# Patient Record
Sex: Male | Born: 1995 | Race: Black or African American | Hispanic: No | Marital: Single | Smoking: Never smoker
Health system: Southern US, Community
[De-identification: ages and names within clinical notes are randomized; demographics above are authoritative.]

## PROBLEM LIST (undated history)

## (undated) DIAGNOSIS — S43006A Unspecified dislocation of unspecified shoulder joint, initial encounter: Secondary | ICD-10-CM

## (undated) DIAGNOSIS — Z98811 Dental restoration status: Secondary | ICD-10-CM

## (undated) DIAGNOSIS — S43014A Anterior dislocation of right humerus, initial encounter: Secondary | ICD-10-CM

## (undated) DIAGNOSIS — M419 Scoliosis, unspecified: Secondary | ICD-10-CM

## (undated) DIAGNOSIS — Z9109 Other allergy status, other than to drugs and biological substances: Secondary | ICD-10-CM

## (undated) DIAGNOSIS — M24119 Other articular cartilage disorders, unspecified shoulder: Secondary | ICD-10-CM

## (undated) HISTORY — PX: NO PAST SURGERIES: SHX2092

---
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
## 2014-07-24 ENCOUNTER — Ambulatory Visit: Payer: Medicaid Other

## 2014-07-30 ENCOUNTER — Emergency Department (HOSPITAL_COMMUNITY): Payer: Medicaid Other

## 2014-07-30 ENCOUNTER — Encounter (HOSPITAL_COMMUNITY): Payer: Self-pay

## 2014-07-30 ENCOUNTER — Emergency Department (HOSPITAL_COMMUNITY)
Admission: EM | Admit: 2014-07-30 | Discharge: 2014-07-30 | Disposition: A | Discharge status: Home / Self Care | Payer: Medicaid Other | Attending: Emergency Medicine | Admitting: Emergency Medicine

## 2014-07-30 DIAGNOSIS — Y92321 Football field as the place of occurrence of the external cause: Secondary | ICD-10-CM | POA: Insufficient documentation

## 2014-07-30 DIAGNOSIS — S43001A Unspecified subluxation of right shoulder joint, initial encounter: Secondary | ICD-10-CM | POA: Diagnosis not present

## 2014-07-30 DIAGNOSIS — Y9361 Activity, american tackle football: Secondary | ICD-10-CM | POA: Diagnosis not present

## 2014-07-30 DIAGNOSIS — Y998 Other external cause status: Secondary | ICD-10-CM | POA: Diagnosis not present

## 2014-07-30 DIAGNOSIS — S43004A Unspecified dislocation of right shoulder joint, initial encounter: Secondary | ICD-10-CM

## 2014-07-30 DIAGNOSIS — Z8739 Personal history of other diseases of the musculoskeletal system and connective tissue: Secondary | ICD-10-CM | POA: Diagnosis not present

## 2014-07-30 DIAGNOSIS — W19XXXA Unspecified fall, initial encounter: Secondary | ICD-10-CM

## 2014-07-30 DIAGNOSIS — W1839XA Other fall on same level, initial encounter: Secondary | ICD-10-CM | POA: Insufficient documentation

## 2014-07-30 DIAGNOSIS — S4991XA Unspecified injury of right shoulder and upper arm, initial encounter: Secondary | ICD-10-CM | POA: Diagnosis present

## 2014-07-30 DIAGNOSIS — R52 Pain, unspecified: Secondary | ICD-10-CM

## 2014-07-30 HISTORY — DX: Scoliosis, unspecified: M41.9

## 2014-07-30 IMAGING — CR DG SHOULDER 2+V*R*
2 series · 2 of 2 positions shown · non-contrast
Comparison: None.

CLINICAL DATA: Acute right shoulder pain after playing football.

EXAM:
RIGHT SHOULDER - 2+ VIEW

[x shoulder ap right (1 of 2)]
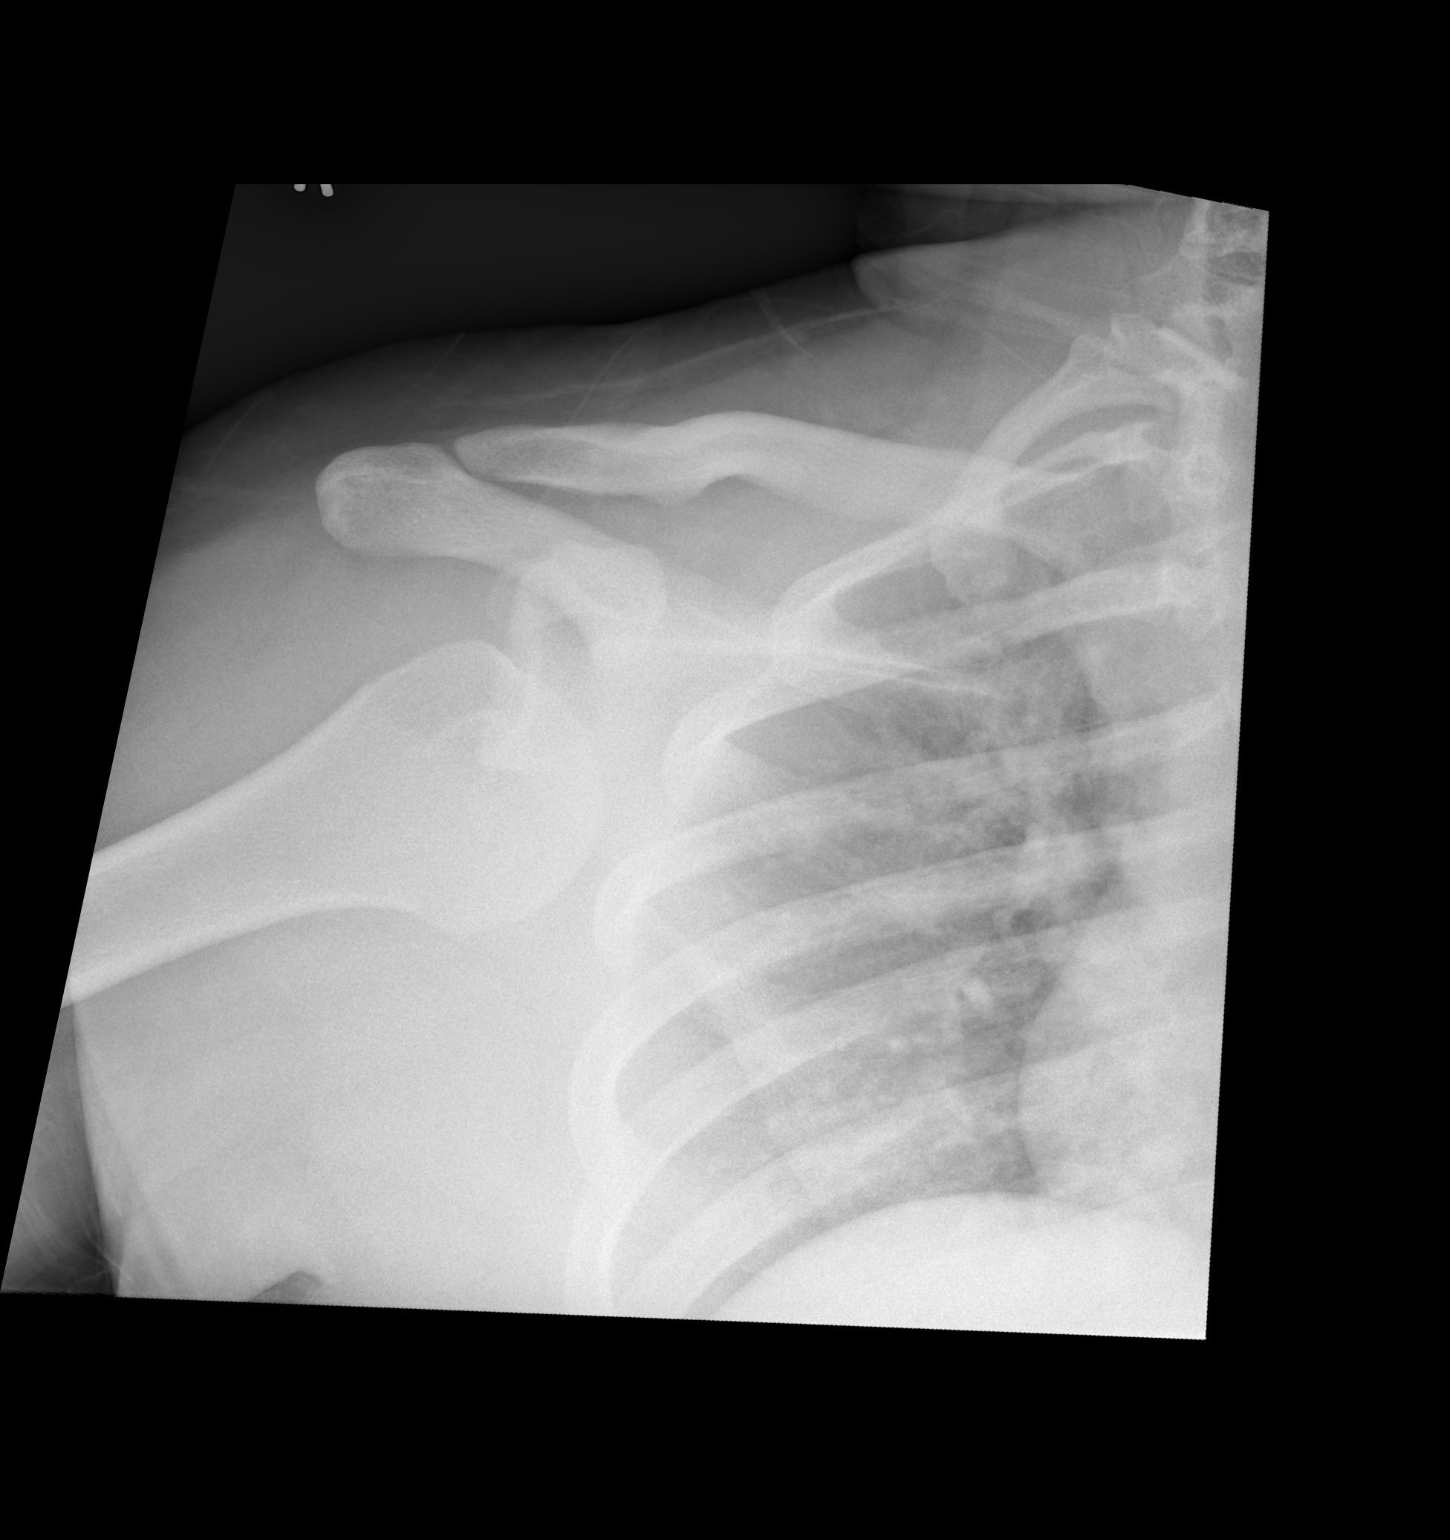

[x shoulder ap right (2 of 2)]
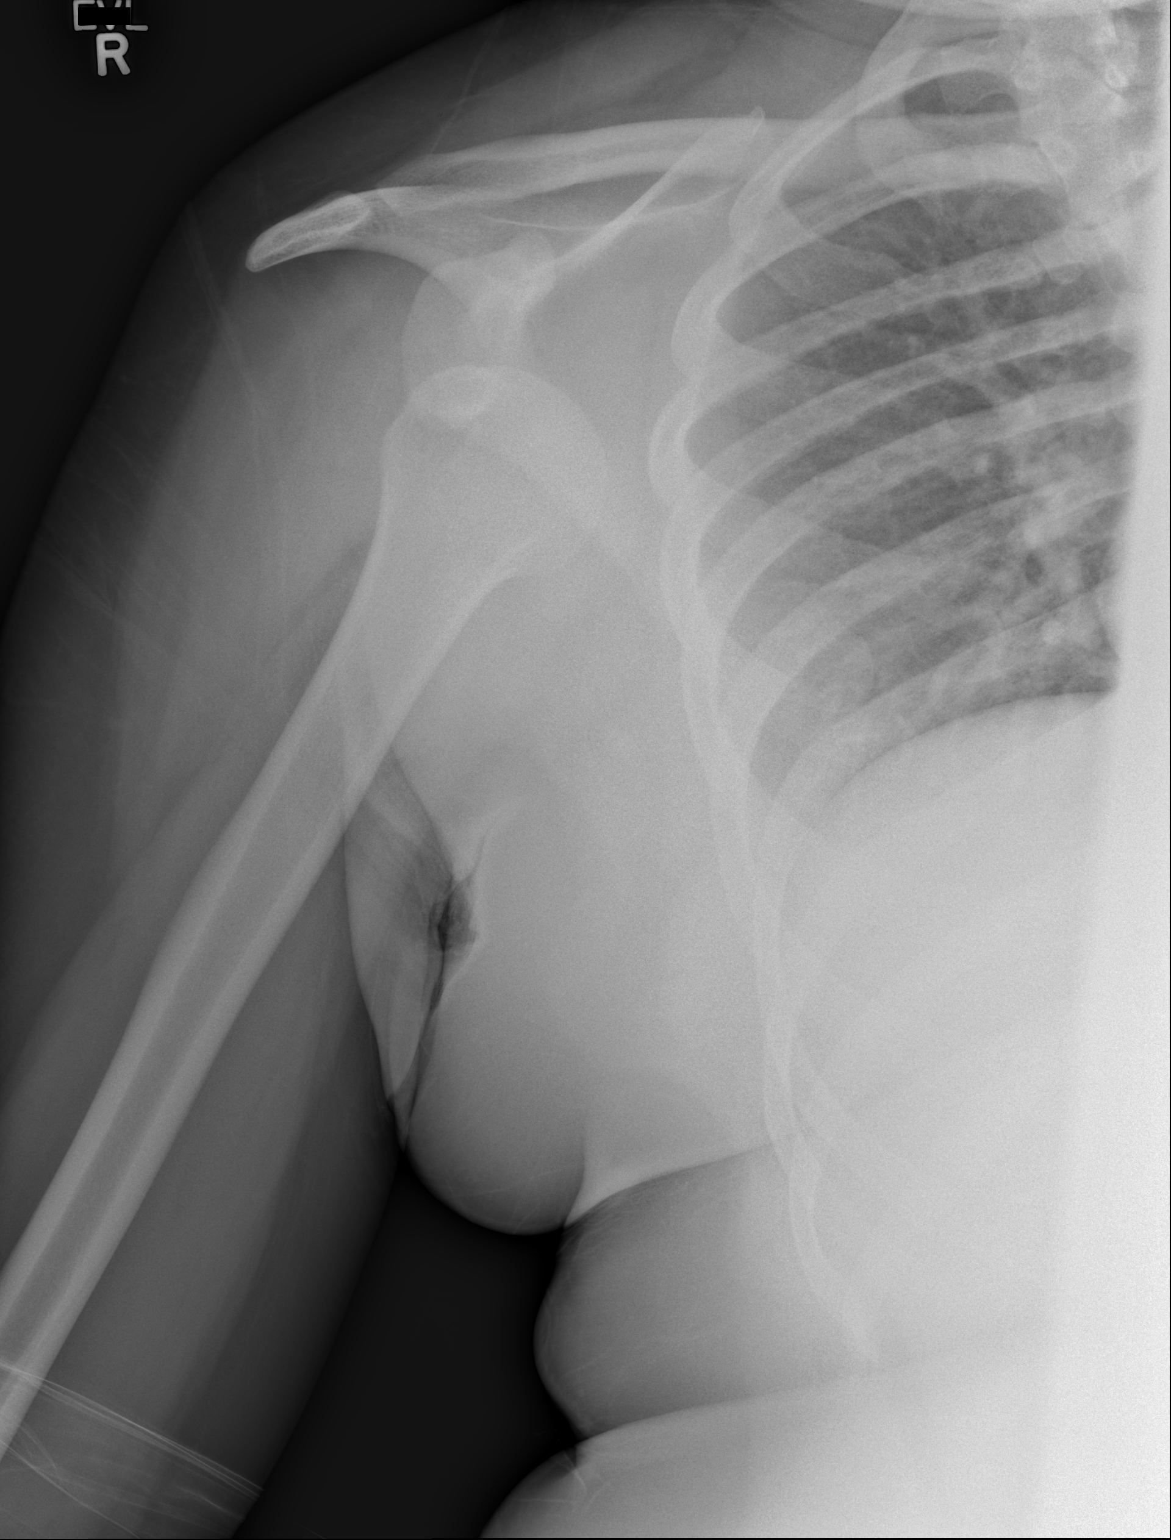

[2 of 2 positions shown; findings below may reference images not displayed]

FINDINGS: Right proximal humeral head is in sub coracoid position consistent
with anterior dislocation. No definite fracture or dislocation is
noted. Visualized ribs appear normal.
IMPRESSION: Anterior dislocation of right proximal humeral head.

## 2014-07-30 IMAGING — CR DG ELBOW COMPLETE 3+V*R*
4 series · 4 of 4 positions shown · non-contrast
Comparison: Right shoulder [DATE]

CLINICAL DATA: Pt was playing football today and injured his Lt
arm, c/o pain mainly towards humeral head and about the proximal [DATE]
of humerus, states Lt fingers are twitching. No prior injury to
area, pt in a lot of pain.

EXAM:
RIGHT ELBOW - COMPLETE 3+ VIEW

[x elbow obl right (1 of 2)]
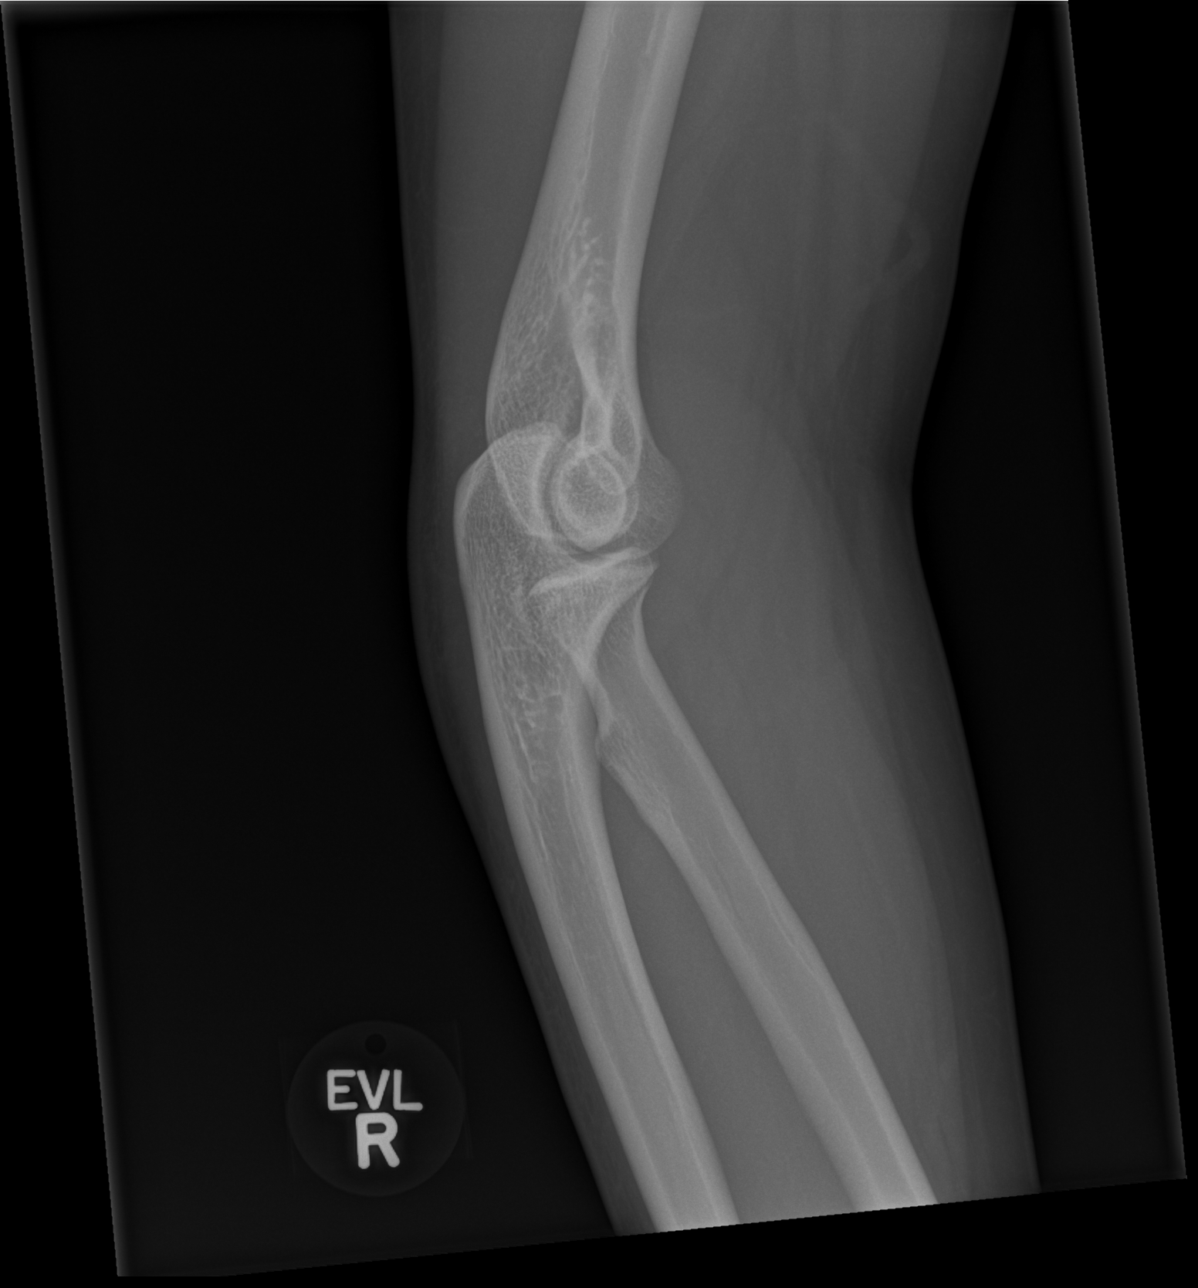

[x elbow lat right]
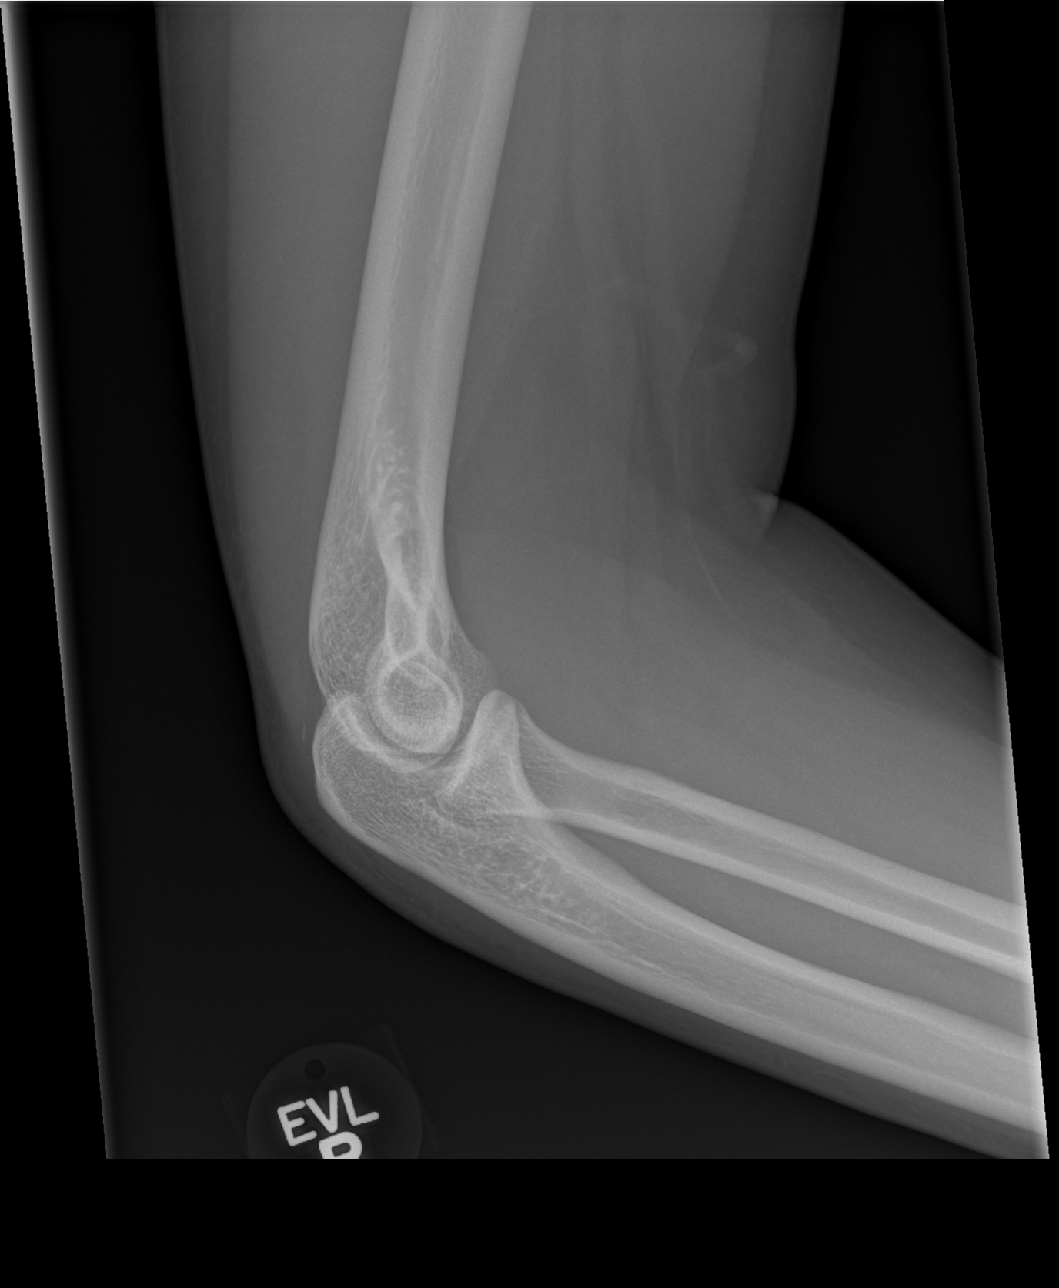

[x elbow ap right]
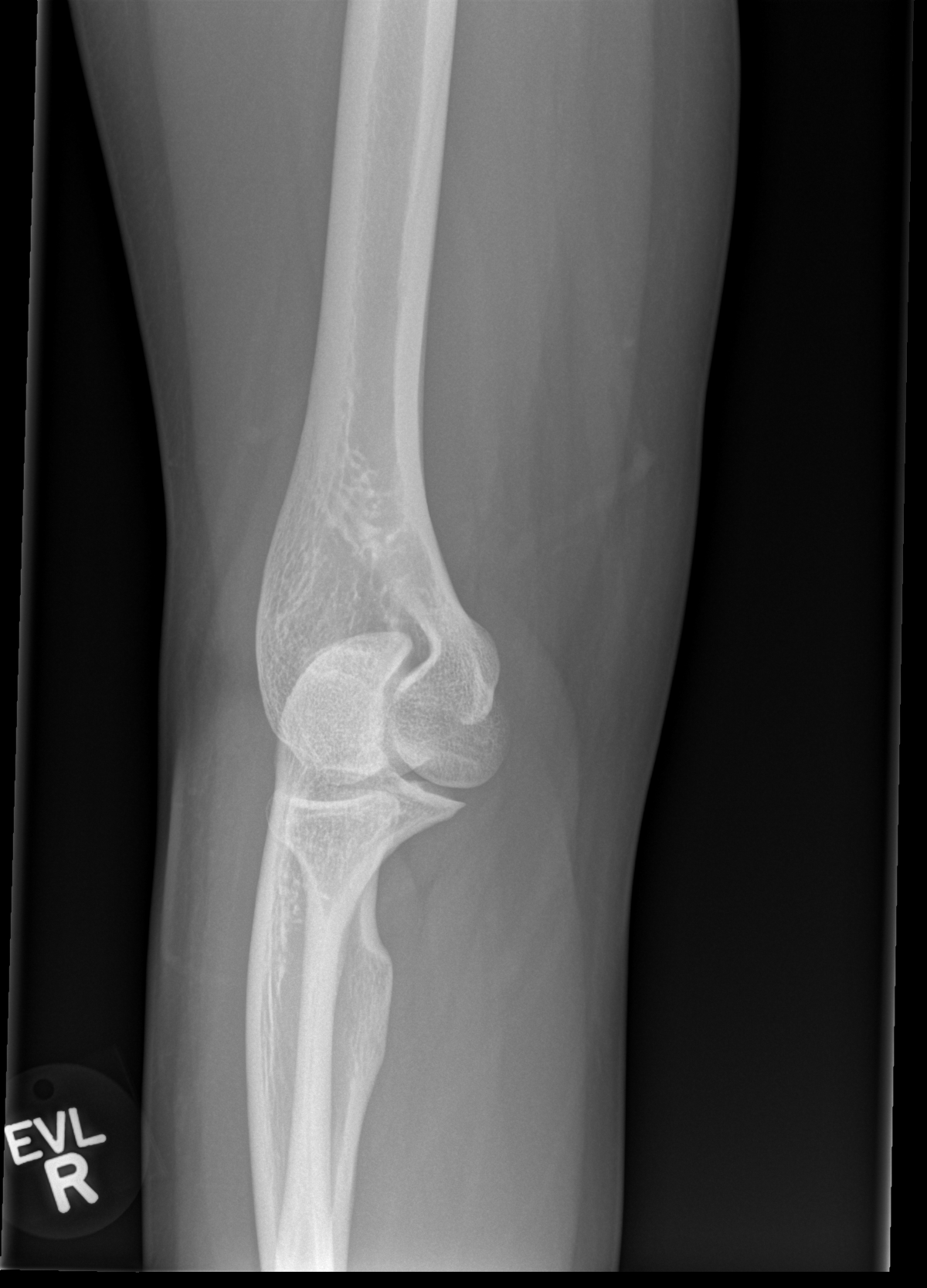

[x elbow obl right (2 of 2)]
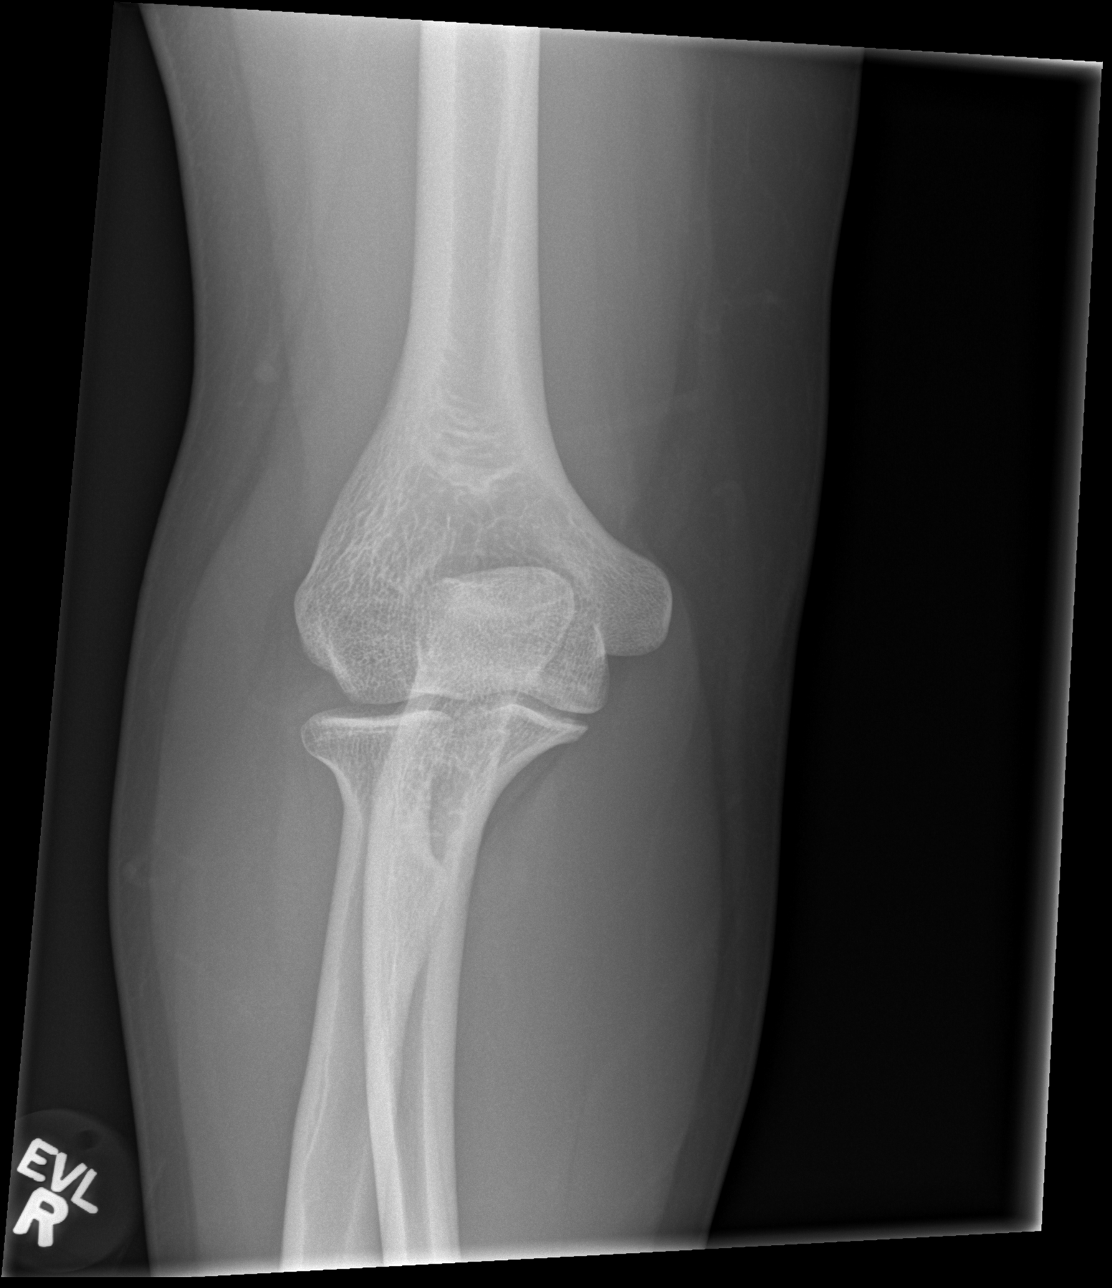

[4 of 4 positions shown; findings below may reference images not displayed]

FINDINGS: There is no evidence of fracture, dislocation, or joint effusion.
There is no evidence of arthropathy or other focal bone abnormality.
Soft tissues are unremarkable.
IMPRESSION: Negative.

## 2014-07-30 MED ORDER — HYDROCODONE-ACETAMINOPHEN 5-325 MG PO TABS: 1.0000 | ORAL_TABLET | Freq: Once | ORAL | Status: DC

## 2014-07-30 MED ORDER — HYDROCODONE-ACETAMINOPHEN 5-325 MG PO TABS
2.0000 | ORAL_TABLET | Freq: Once | ORAL | Status: AC
  Administered 2014-07-30: 2 via ORAL
  Filled 2014-07-30: qty 2

## 2014-07-30 MED ORDER — HYDROCODONE-ACETAMINOPHEN 5-325 MG PO TABS: 1.0000 | ORAL_TABLET | Freq: Four times a day (QID) | ORAL | Status: DC | PRN

## 2014-07-30 NOTE — ED Notes (Signed)
Bed: [id number] Expected date:  Expected time:  Means of arrival:  Comments: Hold for FT pt

## 2014-07-30 NOTE — Discharge Instructions (Signed)
Dislocation, General °A dislocation is a condition in which joint surfaces which are normally next to each other are no longer against each other. The bones are out of place.  °SYMPTOMS  °This is usually associated with pain, swelling and an inability to move the joint. There is also a deformity of the joint.  °DIAGNOSIS  °This diagnosis is easily made on examination. X-rays are often taken to make sure a broken bone (fracture) is not present. °TREATMENT  °Joint dislocations need treatment. Treatment involves putting the bones back in place (reduction). If left untreated, the dislocation can result in deformities with an unstable joint. Some joint dislocations which result in extensive damage to ligaments, cartilage, and other tissue may require surgery. °HOME CARE INSTRUCTIONS  °· Put ice on the injured area. °¨ Put ice in a plastic bag. °¨ Place a towel between your skin and the bag. °¨ Leave the ice on for 15-20 minutes, 03-04 times per day. Do this while awake, for the first 2 days. °· Keep the injured part raised (elevated) if possible, to lessen swelling. °· Continue activities as directed. °· If a lower extremity was dislocated, use crutches, a cane, or a walker as directed. °· Only take over-the-counter or prescription medicines for pain, discomfort, or fever as directed by your caregiver. °SEEK IMMEDIATE MEDICAL CARE IF:  °· There is an increase in bruising, swelling, or pain in the area of the dislocated joint. °· You notice coldness or numbness of the parts beyond the dislocation. °· There is no pain relief from medicines. °· There is severe pain. °· It appears or feels like the bones are out of place again. °MAKE SURE YOU:  °· Understand these instructions. °· Will watch your condition. °· Will get help right away if you are not doing well or get worse. °Document Released: 05/22/2006 Document Revised: 10/27/2011 Document Reviewed: 07/19/2007 °[employer]® Patient Information ©2015 [employer]. This  information is not intended to replace advice given to you by your health care provider. Make sure you discuss any questions you have with your health care provider. ° °

## 2014-07-30 NOTE — ED Notes (Signed)
Pt presents with c/o right arm injury. Pt reports he is hurting on his upper arm, believes he "popped his bicep out of place". Pt reports he injured his arm while playing football.

## 2014-07-30 NOTE — ED Provider Notes (Signed)
CSN: [id number]     Arrival date & time 07/30/14  1740 History   First MD Initiated Contact with Patient 07/30/14 1743     Chief Complaint  Patient presents with  . Arm Injury     (Consider location/radiation/quality/duration/timing/severity/associated sxs/prior Treatment) HPI Comments: Pt come in today with c/o right shoulder/upper arm pain that started a short time ago after being tackled playing football and landing in the are. Pt states that the are is painful to moved. Denies numbness or weakness. No previous injury to the area. Denies chest pain or sob. No loc with injury.  The history is provided by the patient. No language interpreter was used.    Past Medical History  Diagnosis Date  . Scoliosis    History reviewed. No pertinent past surgical history. No family history on file. History  Substance Use Topics  . Smoking status: Never Smoker   . Smokeless tobacco: Not on file  . Alcohol Use: No    Review of Systems  All other systems reviewed and are negative.     Allergies  Review of patient's allergies indicates no known allergies.  Home Medications   Prior to Admission medications   Not on File   BP 140/107 mmHg  Pulse 67  Temp(Src) 98 F (36.7 C) (Oral)  Resp 18  SpO2 99% Physical Exam  Constitutional: He is oriented to person, place, and time. He appears well-developed and well-nourished.  Cardiovascular: Normal rate and regular rhythm.   Pulmonary/Chest: Effort normal and breath sounds normal.  Musculoskeletal:       Cervical back: He exhibits no tenderness.       Thoracic back: He exhibits no tenderness.       Lumbar back: He exhibits no bony tenderness.  Tender in the lateral right shoulder. Pt is able to extend the arm but unable to flex the area. No swelling or deformity noted  Neurological: He is alert and oriented to person, place, and time. He exhibits normal muscle tone. Coordination normal.  Nursing note and vitals reviewed.   ED  Course  Procedures (including critical care time) Labs Review Labs Reviewed - No data to display  Imaging Review Dg Shoulder Right  07/30/2014   CLINICAL DATA:  Right shoulder injury playing football today. Shoulder pain. Initial encounter.  EXAM: RIGHT SHOULDER - 2+ VIEW  COMPARISON:  Right shoulder radiographs earlier today at 1835 hr  FINDINGS: Right glenohumeral dislocation on prior radiographs has been reduced. No acute fracture is identified. Bone mineralization appears normal.  IMPRESSION: Interval reduction of right glenohumeral dislocation. No fracture identified.   Electronically Signed   By: Sebastian AcheAllen  Grady   On: 07/30/2014 19:47   Dg Shoulder Right  07/30/2014   CLINICAL DATA:  Acute right shoulder pain after playing football.  EXAM: RIGHT SHOULDER - 2+ VIEW  COMPARISON:  None.  FINDINGS: Right proximal humeral head is in sub coracoid position consistent with anterior dislocation. No definite fracture or dislocation is noted. Visualized ribs appear normal.  IMPRESSION: Anterior dislocation of right proximal humeral head.   Electronically Signed   By: Roque LiasJames  Green M.D.   On: 07/30/2014 18:55   Dg Elbow Complete Right  07/30/2014   CLINICAL DATA:  Pt was playing football today and injured his Lt arm, c/o pain mainly towards humeral head and about the proximal 1/3 of humerus, states Lt fingers are twitching. No prior injury to area, pt in a lot of pain.  EXAM: RIGHT ELBOW - COMPLETE 3+ VIEW  COMPARISON:  Right shoulder 08/01/2014  FINDINGS: There is no evidence of fracture, dislocation, or joint effusion. There is no evidence of arthropathy or other focal bone abnormality. Soft tissues are unremarkable.  IMPRESSION: Negative.   Electronically Signed   By: Rosalie GumsBeth  Brown M.D.   On: 07/30/2014 18:56     EKG Interpretation None      MDM   Final diagnoses:  Fall  Pain  Shoulder dislocation, right, initial encounter    Pt relocated his shoulder on his own. Pt placed in sling and given  ortho follow up. neurologically intact    Teressa LowerVrinda Tywon Niday, NP 07/30/14 1958  Warnell Foresterrey Wofford, MD 08/02/14 [phone]

## 2015-02-16 DIAGNOSIS — M24119 Other articular cartilage disorders, unspecified shoulder: Secondary | ICD-10-CM

## 2015-02-16 DIAGNOSIS — S43006A Unspecified dislocation of unspecified shoulder joint, initial encounter: Secondary | ICD-10-CM

## 2015-02-16 HISTORY — DX: Unspecified dislocation of unspecified shoulder joint, initial encounter: S43.006A

## 2015-02-16 HISTORY — DX: Other articular cartilage disorders, unspecified shoulder: M24.119

## 2015-02-23 ENCOUNTER — Encounter (HOSPITAL_BASED_OUTPATIENT_CLINIC_OR_DEPARTMENT_OTHER): Payer: Self-pay | Admitting: *Deleted

## 2015-02-27 ENCOUNTER — Other Ambulatory Visit: Payer: Self-pay | Admitting: Orthopedic Surgery

## 2015-03-02 ENCOUNTER — Ambulatory Visit (HOSPITAL_BASED_OUTPATIENT_CLINIC_OR_DEPARTMENT_OTHER): Payer: Medicaid Other | Admitting: Certified Registered"

## 2015-03-02 ENCOUNTER — Encounter (HOSPITAL_BASED_OUTPATIENT_CLINIC_OR_DEPARTMENT_OTHER): Payer: Self-pay | Admitting: Anesthesiology

## 2015-03-02 ENCOUNTER — Encounter (HOSPITAL_BASED_OUTPATIENT_CLINIC_OR_DEPARTMENT_OTHER)
Admission: RE | Disposition: A | Discharge status: Home / Self Care | Payer: Self-pay | Source: Ambulatory Visit | Attending: Orthopedic Surgery

## 2015-03-02 ENCOUNTER — Ambulatory Visit (HOSPITAL_BASED_OUTPATIENT_CLINIC_OR_DEPARTMENT_OTHER)
Admission: RE | Admit: 2015-03-02 | Discharge: 2015-03-02 | Disposition: A | Discharge status: Home / Self Care | Payer: Medicaid Other | Source: Ambulatory Visit | Attending: Orthopedic Surgery | Admitting: Orthopedic Surgery

## 2015-03-02 DIAGNOSIS — G540 Brachial plexus disorders: Secondary | ICD-10-CM | POA: Diagnosis not present

## 2015-03-02 DIAGNOSIS — S43014A Anterior dislocation of right humerus, initial encounter: Secondary | ICD-10-CM

## 2015-03-02 DIAGNOSIS — M419 Scoliosis, unspecified: Secondary | ICD-10-CM | POA: Diagnosis not present

## 2015-03-02 DIAGNOSIS — M24411 Recurrent dislocation, right shoulder: Secondary | ICD-10-CM | POA: Insufficient documentation

## 2015-03-02 DIAGNOSIS — M25311 Other instability, right shoulder: Secondary | ICD-10-CM | POA: Diagnosis not present

## 2015-03-02 HISTORY — DX: Dental restoration status: Z98.811

## 2015-03-02 HISTORY — DX: Other allergy status, other than to drugs and biological substances: Z91.09

## 2015-03-02 HISTORY — DX: Unspecified dislocation of unspecified shoulder joint, initial encounter: S43.006A

## 2015-03-02 HISTORY — DX: Anterior dislocation of right humerus, initial encounter: S43.014A

## 2015-03-02 HISTORY — DX: Other articular cartilage disorders, unspecified shoulder: M24.119

## 2015-03-02 HISTORY — PX: SHOULDER ARTHROSCOPY WITH BANKART REPAIR: SHX5673

## 2015-03-02 LAB — POCT HEMOGLOBIN-HEMACUE: Hemoglobin: 14.8 g/dL (ref 13.0–17.0)

## 2015-03-02 SURGERY — SHOULDER ARTHROSCOPY WITH BANKART REPAIR
Anesthesia: Regional | Site: Shoulder | Laterality: Right

## 2015-03-02 MED ORDER — ONDANSETRON HCL 4 MG PO TABS: 4.0000 mg | ORAL_TABLET | Freq: Three times a day (TID) | ORAL | Status: DC | PRN

## 2015-03-02 MED ORDER — SODIUM CHLORIDE 0.9 % IR SOLN
Status: DC | PRN
Start: 2015-03-02 — End: 2015-03-02
  Administered 2015-03-02: 7500 mL

## 2015-03-02 MED ORDER — LACTATED RINGERS IV SOLN
INTRAVENOUS | Status: DC
  Administered 2015-03-02 (×2): via INTRAVENOUS

## 2015-03-02 MED ORDER — FENTANYL CITRATE (PF) 100 MCG/2ML IJ SOLN
INTRAMUSCULAR | Status: AC
  Filled 2015-03-02: qty 6

## 2015-03-02 MED ORDER — MEPERIDINE HCL 25 MG/ML IJ SOLN: 6.2500 mg | INTRAMUSCULAR | Status: DC | PRN

## 2015-03-02 MED ORDER — OXYCODONE HCL 5 MG PO TABS: 5.0000 mg | ORAL_TABLET | Freq: Once | ORAL | Status: DC | PRN

## 2015-03-02 MED ORDER — CEFAZOLIN SODIUM-DEXTROSE 2-3 GM-% IV SOLR
2.0000 g | INTRAVENOUS | Status: AC
  Administered 2015-03-02: 2 g via INTRAVENOUS

## 2015-03-02 MED ORDER — SCOPOLAMINE 1 MG/3DAYS TD PT72: 1.0000 | MEDICATED_PATCH | Freq: Once | TRANSDERMAL | Status: DC | PRN

## 2015-03-02 MED ORDER — PROPOFOL 10 MG/ML IV BOLUS
INTRAVENOUS | Status: DC | PRN
  Administered 2015-03-02: 200 mg via INTRAVENOUS

## 2015-03-02 MED ORDER — MIDAZOLAM HCL 2 MG/2ML IJ SOLN
INTRAMUSCULAR | Status: AC
  Filled 2015-03-02: qty 2

## 2015-03-02 MED ORDER — FENTANYL CITRATE (PF) 100 MCG/2ML IJ SOLN
50.0000 ug | INTRAMUSCULAR | Status: DC | PRN
Start: 2015-03-02 — End: 2015-03-02
  Administered 2015-03-02 (×2): 100 ug via INTRAVENOUS

## 2015-03-02 MED ORDER — FENTANYL CITRATE (PF) 100 MCG/2ML IJ SOLN
INTRAMUSCULAR | Status: AC
  Filled 2015-03-02: qty 2

## 2015-03-02 MED ORDER — GLYCOPYRROLATE 0.2 MG/ML IJ SOLN: 0.2000 mg | Freq: Once | INTRAMUSCULAR | Status: DC | PRN

## 2015-03-02 MED ORDER — LIDOCAINE HCL (CARDIAC) 20 MG/ML IV SOLN
INTRAVENOUS | Status: DC | PRN
  Administered 2015-03-02: 30 mg via INTRAVENOUS

## 2015-03-02 MED ORDER — BUPIVACAINE-EPINEPHRINE (PF) 0.5% -1:200000 IJ SOLN
INTRAMUSCULAR | Status: DC | PRN
  Administered 2015-03-02: 25 mL via PERINEURAL

## 2015-03-02 MED ORDER — SUCCINYLCHOLINE CHLORIDE 20 MG/ML IJ SOLN
INTRAMUSCULAR | Status: DC | PRN
  Administered 2015-03-02: 100 mg via INTRAVENOUS

## 2015-03-02 MED ORDER — HYDROMORPHONE HCL 1 MG/ML IJ SOLN: 0.2500 mg | INTRAMUSCULAR | Status: DC | PRN

## 2015-03-02 MED ORDER — BACLOFEN 10 MG PO TABS: 10.0000 mg | ORAL_TABLET | Freq: Three times a day (TID) | ORAL | Status: DC

## 2015-03-02 MED ORDER — MIDAZOLAM HCL 2 MG/2ML IJ SOLN
1.0000 mg | INTRAMUSCULAR | Status: DC | PRN
  Administered 2015-03-02 (×2): 2 mg via INTRAVENOUS

## 2015-03-02 MED ORDER — SENNA-DOCUSATE SODIUM 8.6-50 MG PO TABS: 2.0000 | ORAL_TABLET | Freq: Every day | ORAL | Status: DC

## 2015-03-02 MED ORDER — CEFAZOLIN SODIUM-DEXTROSE 2-3 GM-% IV SOLR
INTRAVENOUS | Status: AC
  Filled 2015-03-02: qty 50

## 2015-03-02 MED ORDER — ONDANSETRON HCL 4 MG/2ML IJ SOLN
INTRAMUSCULAR | Status: DC | PRN
  Administered 2015-03-02: 4 mg via INTRAVENOUS

## 2015-03-02 MED ORDER — OXYCODONE-ACETAMINOPHEN 5-325 MG PO TABS: 1.0000 | ORAL_TABLET | Freq: Four times a day (QID) | ORAL | Status: DC | PRN

## 2015-03-02 MED ORDER — DEXAMETHASONE SODIUM PHOSPHATE 4 MG/ML IJ SOLN
INTRAMUSCULAR | Status: DC | PRN
  Administered 2015-03-02: 10 mg via INTRAVENOUS

## 2015-03-02 MED ORDER — OXYCODONE HCL 5 MG/5ML PO SOLN: 5.0000 mg | Freq: Once | ORAL | Status: DC | PRN

## 2015-03-02 SURGICAL SUPPLY — 68 items
ANCHOR SUT BIOCOMP LK 2.9X12.5 (Anchor) ×3 IMPLANT
BLADE CUTTER GATOR 3.5 (BLADE) ×3 IMPLANT
BLADE GREAT WHITE 4.2 (BLADE) IMPLANT
BLADE GREAT WHITE 4.2MM (BLADE)
BLADE SURG 15 STRL LF DISP TIS (BLADE) IMPLANT
BLADE SURG 15 STRL SS (BLADE)
BNDG COHESIVE 3X5 TAN STRL LF (GAUZE/BANDAGES/DRESSINGS) ×3 IMPLANT
BUR OVAL 6.0 (BURR) IMPLANT
CANNULA 5.75X71 LONG (CANNULA) ×3 IMPLANT
CANNULA TWIST IN 8.25X7CM (CANNULA) ×3 IMPLANT
CANNULA TWIST IN 8.25X9CM (CANNULA) IMPLANT
CLOSURE STERI-STRIP 1/2X4 (GAUZE/BANDAGES/DRESSINGS) ×1
CLSR STERI-STRIP ANTIMIC 1/2X4 (GAUZE/BANDAGES/DRESSINGS) ×2 IMPLANT
DECANTER SPIKE VIAL GLASS SM (MISCELLANEOUS) IMPLANT
DRAPE INCISE IOBAN 66X45 STRL (DRAPES) ×3 IMPLANT
DRAPE SHOULDER BEACH CHAIR (DRAPES) ×3 IMPLANT
DRAPE U 20/CS (DRAPES) ×3 IMPLANT
DRAPE U-SHAPE 47X51 STRL (DRAPES) ×3 IMPLANT
DRSG PAD ABDOMINAL 8X10 ST (GAUZE/BANDAGES/DRESSINGS) ×3 IMPLANT
DURAPREP 26ML APPLICATOR (WOUND CARE) ×3 IMPLANT
ELECT REM PT RETURN 9FT ADLT (ELECTROSURGICAL)
ELECTRODE REM PT RTRN 9FT ADLT (ELECTROSURGICAL) IMPLANT
FIBERSTICK 2 (SUTURE) IMPLANT
GAUZE SPONGE 4X4 12PLY STRL (GAUZE/BANDAGES/DRESSINGS) ×3 IMPLANT
GLOVE BIO SURGEON STRL SZ7 (GLOVE) ×3 IMPLANT
GLOVE BIO SURGEON STRL SZ8 (GLOVE) IMPLANT
GLOVE BIOGEL PI IND STRL 7.0 (GLOVE) ×1 IMPLANT
GLOVE BIOGEL PI IND STRL 8 (GLOVE) ×1 IMPLANT
GLOVE BIOGEL PI INDICATOR 7.0 (GLOVE) ×2
GLOVE BIOGEL PI INDICATOR 8 (GLOVE) ×2
GLOVE ECLIPSE 6.5 STRL STRAW (GLOVE) ×3 IMPLANT
GLOVE ORTHO TXT STRL SZ7.5 (GLOVE) ×3 IMPLANT
GOWN STRL REUS W/ TWL LRG LVL3 (GOWN DISPOSABLE) ×1 IMPLANT
GOWN STRL REUS W/ TWL XL LVL3 (GOWN DISPOSABLE) ×2 IMPLANT
GOWN STRL REUS W/TWL LRG LVL3 (GOWN DISPOSABLE) ×2
GOWN STRL REUS W/TWL XL LVL3 (GOWN DISPOSABLE) ×4
IMMOBILIZER SHOULDER FOAM XLGE (SOFTGOODS) ×3 IMPLANT
IV NS IRRIG 3000ML ARTHROMATIC (IV SOLUTION) ×9 IMPLANT
KIT PUSHLOCK 2.9 HIP (KITS) ×3 IMPLANT
KIT SHOULDER TRACTION (DRAPES) ×3 IMPLANT
LASSO 90 CVE QUICKPAS (DISPOSABLE) ×3 IMPLANT
MANIFOLD NEPTUNE II (INSTRUMENTS) ×3 IMPLANT
NEEDLE SCORPION MULTI FIRE (NEEDLE) IMPLANT
PACK ARTHROSCOPY DSU (CUSTOM PROCEDURE TRAY) ×3 IMPLANT
PACK BASIN DAY SURGERY FS (CUSTOM PROCEDURE TRAY) ×3 IMPLANT
SET ARTHROSCOPY TUBING (MISCELLANEOUS) ×2
SET ARTHROSCOPY TUBING LN (MISCELLANEOUS) ×1 IMPLANT
SHEET MEDIUM DRAPE 40X70 STRL (DRAPES) ×3 IMPLANT
SLEEVE SCD COMPRESS KNEE MED (MISCELLANEOUS) ×3 IMPLANT
SLING ARM IMMOBILIZER LRG (SOFTGOODS) IMPLANT
SLING ARM IMMOBILIZER MED (SOFTGOODS) IMPLANT
SLING ARM LRG ADULT FOAM STRAP (SOFTGOODS) IMPLANT
SLING ARM MED ADULT FOAM STRAP (SOFTGOODS) IMPLANT
SLING ARM XL FOAM STRAP (SOFTGOODS) IMPLANT
SUT FIBERWIRE #2 38 T-5 BLUE (SUTURE)
SUT MNCRL AB 4-0 PS2 18 (SUTURE) IMPLANT
SUT PDS AB 1 CT  36 (SUTURE)
SUT PDS AB 1 CT 36 (SUTURE) IMPLANT
SUT TIGER TAPE 7 IN WHITE (SUTURE) IMPLANT
SUT VIC AB 3-0 SH 27 (SUTURE)
SUT VIC AB 3-0 SH 27X BRD (SUTURE) IMPLANT
SUTURE FIBERWR #2 38 T-5 BLUE (SUTURE) IMPLANT
TAPE FIBER 2MM 7IN #2 BLUE (SUTURE) IMPLANT
TAPE LABRALWHITE 1.5X36 (TAPE) IMPLANT
TAPE SUT LABRALTAP WHT/BLK (SUTURE) ×3 IMPLANT
TOWEL OR 17X24 6PK STRL BLUE (TOWEL DISPOSABLE) ×6 IMPLANT
TOWEL OR NON WOVEN STRL DISP B (DISPOSABLE) ×3 IMPLANT
WATER STERILE IRR 1000ML POUR (IV SOLUTION) ×3 IMPLANT

## 2015-03-02 NOTE — Progress Notes (Signed)
Assisted Dr. Crews with right, ultrasound guided, interscalene  block. Side rails up, monitors on throughout procedure. See vital signs in flow sheet. Tolerated Procedure well. 

## 2015-03-02 NOTE — Transfer of Care (Signed)
Immediate Anesthesia Transfer of Care Note  Patient: Roger Jackson  Procedure(s) Performed: Procedure(s): RIGHT SHOULDER ARTHROSCOPY WITH DEBRIDEMENT, BANKART REPAIR (Right)  Patient Location: PACU  Anesthesia Type:GA combined with regional for post-op pain  Level of Consciousness: awake, alert , oriented and patient cooperative  Airway & Oxygen Therapy: Patient Spontanous Breathing and Patient connected to face mask oxygen  Post-op Assessment: Report given to RN and Post -op Vital signs reviewed and stable  Post vital signs: Reviewed and stable  Last Vitals:  Filed Vitals:   03/02/15 1148  BP: 124/66  Pulse:   Temp:   Resp:     Complications: No apparent anesthesia complications

## 2015-03-02 NOTE — Discharge Instructions (Signed)
Diet: As you were doing prior to hospitalization  ° °Shower:  May shower but keep the wounds dry, use an occlusive plastic wrap, NO SOAKING IN TUB.  If the bandage gets wet, change with a clean dry gauze. ° °Dressing:  You may change your dressing 3-5 days after surgery.  Then change the dressing daily with sterile gauze dressing.   ° °There are sticky tapes (steri-strips) on your wounds and all the stitches are absorbable.  Leave the steri-strips in place when changing your dressings, they will peel off with time, usually 2-3 weeks. ° °Activity:  Increase activity slowly as tolerated, but follow the weight bearing instructions below.  No lifting or driving for 6 weeks. ° °Weight Bearing:   Sling at all times..   ° °To prevent constipation: you may use a stool softener such as - ° °Colace (over the counter) 100 mg by mouth twice a day  °Drink plenty of fluids (prune juice may be helpful) and high fiber foods °Miralax (over the counter) for constipation as needed.   ° °Itching:  If you experience itching with your medications, try taking only a single pain pill, or even half a pain pill at a time.  You may take up to 10 pain pills per day, and you can also use benadryl over the counter for itching or also to help with sleep.  ° °Precautions:  If you experience chest pain or shortness of breath - call 911 immediately for transfer to the hospital emergency department!! ° °If you develop a fever greater that 101 F, purulent drainage from wound, increased redness or drainage from wound, or calf pain -- Call the office at [phone]                                                °Follow- Up Appointment:  Please call for an appointment to be seen in 2 weeks [place] - [phone] ° ° °Regional Anesthesia Blocks ° °1. Numbness or the inability to move the "blocked" extremity may last from 3-48 hours after placement. The length of time depends on the medication injected and your individual response to the medication.  If the numbness is not going away after 48 hours, call your surgeon. ° °2. The extremity that is blocked will need to be protected until the numbness is gone and the  Strength has returned. Because you cannot feel it, you will need to take extra care to avoid injury. Because it may be weak, you may have difficulty moving it or using it. You may not know what position it is in without looking at it while the block is in effect. ° °3. For blocks in the legs and feet, returning to weight bearing and walking needs to be done carefully. You will need to wait until the numbness is entirely gone and the strength has returned. You should be able to move your leg and foot normally before you try and bear weight or walk. You will need someone to be with you when you first try to ensure you do not fall and possibly risk injury. ° °4. Bruising and tenderness at the needle site are common side effects and will resolve in a few days. ° °5. Persistent numbness or new problems with movement should be communicated to the surgeon or the [facility] ([phone])/ [facility] (  [phone]). ° °Post Anesthesia Home Care Instructions ° °Activity: °Get plenty of rest for the remainder of the day. A responsible adult should stay with you for 24 hours following the procedure.  °For the next 24 hours, DO NOT: °-Drive a car °-Operate machinery °-Drink alcoholic beverages °-Take any medication unless instructed by your physician °-Make any legal decisions or sign important papers. ° °Meals: °Start with liquid foods such as gelatin or soup. Progress to regular foods as tolerated. Avoid greasy, spicy, heavy foods. If nausea and/or vomiting occur, drink only clear liquids until the nausea and/or vomiting subsides. Call your physician if vomiting continues. ° °Special Instructions/Symptoms: °Your throat may feel dry or sore from the anesthesia or the breathing tube placed in your throat during surgery. If this causes  discomfort, gargle with warm salt water. The discomfort should disappear within 24 hours. ° °If you had a scopolamine patch placed behind your ear for the management of post- operative nausea and/or vomiting: ° °1. The medication in the patch is effective for 72 hours, after which it should be removed.  Wrap patch in a tissue and discard in the trash. Wash hands thoroughly with soap and water. °2. You may remove the patch earlier than 72 hours if you experience unpleasant side effects which may include dry mouth, dizziness or visual disturbances. °3. Avoid touching the patch. Wash your hands with soap and water after contact with the patch. °  ° °

## 2015-03-02 NOTE — Anesthesia Procedure Notes (Addendum)
Anesthesia Regional Block:  Interscalene brachial plexus block  Pre-Anesthetic Checklist: ,, timeout performed, Correct Patient, Correct Site, Correct Laterality, Correct Procedure, Correct Position, site marked, Risks and benefits discussed,  Surgical consent,  Pre-op evaluation,  At surgeon's request and post-op pain management  Laterality: Right and Upper  Prep: chloraprep       Needles:  Injection technique: Single-shot  Needle Type: Echogenic Needle     Needle Length: 5cm 5 cm Needle Gauge: 21 and 21 G    Additional Needles:  Procedures: ultrasound guided (picture in chart) Interscalene brachial plexus block Narrative:  Start time: 03/02/2015 8:27 AM End time: 03/02/2015 8:32 AM Injection made incrementally with aspirations every 5 mL.  Performed by: Personally  Anesthesiologist: CREWS, DAVID   Procedure Name: Intubation Date/Time: 03/02/2015 10:12 AM Performed by: Raeghan Demeter D Pre-anesthesia Checklist: Patient identified, Emergency Drugs available, Suction available and Patient being monitored Patient Re-evaluated:Patient Re-evaluated prior to inductionOxygen Delivery Method: Circle System Utilized Preoxygenation: Pre-oxygenation with 100% oxygen Intubation Type: IV induction Ventilation: Mask ventilation without difficulty Laryngoscope Size: Mac and 3 Grade View: Grade I Tube type: Oral Tube size: 7.0 mm Number of attempts: 1 Airway Equipment and Method: Stylet and Oral airway Placement Confirmation: ETT inserted through vocal cords under direct vision,  positive ETCO2 and breath sounds checked- equal and bilateral Secured at: 21 cm Tube secured with: Tape Dental Injury: Teeth and Oropharynx as per pre-operative assessment

## 2015-03-02 NOTE — Anesthesia Postprocedure Evaluation (Signed)
  Anesthesia Post-op Note  Patient: Roger Jackson  Procedure(s) Performed: Procedure(s): RIGHT SHOULDER ARTHROSCOPY WITH DEBRIDEMENT, BANKART REPAIR (Right)  Patient Location: PACU  Anesthesia Type: General, Regional   Level of Consciousness: awake, alert  and oriented  Airway and Oxygen Therapy: Patient Spontanous Breathing  Post-op Pain: none  Post-op Assessment: Post-op Vital signs reviewed  Post-op Vital Signs: Reviewed  Last Vitals:  Filed Vitals:   03/02/15 1251  BP:   Pulse: 87  Temp:   Resp: 16    Complications: No apparent anesthesia complications

## 2015-03-02 NOTE — Op Note (Signed)
03/02/2015  11:29 AM  PATIENT:  Roger MilchAhmed Jackson    PRE-OPERATIVE DIAGNOSIS:  Right glenohumeral dislocation, anterior, recurrent, chronic  POST-OPERATIVE DIAGNOSIS:  Same  PROCEDURE:  RIGHT SHOULDER ARTHROSCOPY WITH DEBRIDEMENT, BANKART REPAIR  SURGEON:  Eulas PostLANDAU,Jamesmichael Shadd P, MD  PHYSICIAN ASSISTANT: Iran OuchKiersten Shepperson, PA-C, present and scrubbed throughout the case critical for completion in a timely fashion and for retraction, instrumentation and closure.  ANESTHESIA:   General  PREOPERATIVE INDICATIONS:  Roger Jackson is a  19 y.o. male with a diagnosis of recurrent right shoulder dislocation  who failed conservative measures and elected for surgical management.    The risks benefits and alternatives were discussed with the patient preoperatively including but not limited to the risks of infection, bleeding, nerve injury, cardiopulmonary complications, the need for revision surgery, among others, and the patient was willing to proceed. We also discussed the risks for recurrent instability, progression of posttraumatic arthritis, among others.  OPERATIVE IMPLANTS: Arthrex bio composite 2.9 mm short push lock anchors 1 with an inverted labral tape through the inferior glenohumeral ligament. Superiorly there was not enough tissue to support a second suture.  OPERATIVE FINDINGS: The shoulder had full motion during examination under anesthesia and was unstable anteriorly, but stable posteriorly. There was extremely atrophic labrum, no real anterior labrum to speak of, the capsule had healed medial on the neck. The subscapularis, biceps tendon, posterior labrum, supraspinatus, infraspinatus, glenoid articular cartilage was all intact. There was minimal anterior bone loss on the glenoid. There was not much in the way of a Hill-Sachs lesion either.  OPERATIVE PROCEDURE: The patient was brought to the operating room and placed in the supine position. General anesthesia was administered. IV antibiotics  were given. General anesthesia was administered.   The upper extremity was examined and found to be grossly unstable particularly to anterior testing. The upper extremity was prepped and draped in the usual sterile fashion. The patient was in a semilateral decubitus position.  Time out was performed. Diagnostic arthroscopy was carried out the above-named findings.   I placed 2 anterior cannulas, and then mobilized the labrum off of the medial neck of the glenoid with the spatula.  I then prepared the neck of the glenoid with a shaver/rasp to optimize healing, while still preserving the anterior bone stock.  The labrum had excellent mobility.   I then used a suture passer to pass an inverted labral fiber tape on either side of the inferior anterior glenohumeral ligament. This had excellent purchase on the tissue.  I anchored the anterior inferior glenohumeral ligament into the glenoid using a push lock anchor.   There was no tissue superiorly to repair, the middle glenohumeral ligament was a wisp of tissue only.  Excellent soft tissue restoration of tension was achieved, and the arthroscopic cannulas were removed, and the portals closed with Monocryl followed by Steri-Strips and sterile gauze. The patient was awakened and returned to the PACU in stable and satisfactory condition. There were no complications and the patient tolerated the procedure well.

## 2015-03-02 NOTE — H&P (Signed)
PREOPERATIVE H&P  Chief Complaint: Right shoulder dislocation/instability  HPI: Roger Jackson is a 19 y.o. male who is had a total of 8 shoulder dislocations, and has had continued recurrent subluxation events. He has limitations with overhead motion and external rotation. 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 He is right-hand dominant. Pain is rated as severe when it goes out of place.  Past Medical History  Diagnosis Date  . Scoliosis   . Shoulder dislocation 02/2015    right  . 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 Dental crown present     upper  . Pollen allergy    Past Surgical History  Procedure Laterality Date  . No past surgeries     History   Social History  . Marital Status: Single    Spouse Name: N/A  . Number of Children: N/A  . Years of Education: N/A   Social History Main Topics  . Smoking status: Never Smoker   . Smokeless tobacco: Never Used  . Alcohol Use: No  . Drug Use: No  . Sexual Activity: Not on file   Other Topics Concern  . None   Social History Narrative   History reviewed. No pertinent family history. No Known Allergies Prior to Admission medications   Not on File     Positive ROS: All other systems have been reviewed and were otherwise negative with the exception of those mentioned in the HPI and as above.  Physical Exam: General: Alert, no acute distress Cardiovascular: No pedal edema Respiratory: No cyanosis, no use of accessory musculature GI: No organomegaly, abdomen is soft and non-tender Skin: No lesions in the area of chief complaint Neurologic: Sensation intact distally Psychiatric: Patient is competent for consent with normal mood and affect Lymphatic: No axillary or cervical lymphadenopathy  MUSCULOSKELETAL: Right shoulder has axillary nerve  that is now intact, although he still has a little bit of numbness over its distribution on the shoulder. Active motion is 0-170 with external rotation to 30.  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 We have also discussed the risks for persistent neurologic dysfunction, as well as recurrent instability, need for future revision surgery, among others.  Eulas PostLANDAU,Astrid Vides P, MD Cell [phone]   03/02/2015 10:00 AM

## 2015-03-02 NOTE — Anesthesia Preprocedure Evaluation (Addendum)
Anesthesia Evaluation  Patient identified by MRN, date of birth, ID band Patient awake    Reviewed: Allergy & Precautions, NPO status , Patient's Chart, lab work & pertinent test results  Airway Mallampati: II  TM Distance: >3 FB Neck ROM: Full    Dental  (+) Teeth Intact, Dental Advisory Given   Pulmonary  breath sounds clear to auscultation        Cardiovascular Rhythm:Regular Rate:Normal     Neuro/Psych    GI/Hepatic   Endo/Other  Morbid obesity  Renal/GU      Musculoskeletal   Abdominal   Peds  Hematology   Anesthesia Other Findings   Reproductive/Obstetrics                            Anesthesia Physical Anesthesia Plan  ASA: II  Anesthesia Plan: General and Regional   Post-op Pain Management:    Induction: Intravenous  Airway Management Planned: Oral ETT  Additional Equipment:   Intra-op Plan:   Post-operative Plan: Extubation in OR  Informed Consent: I have reviewed the patients History and Physical, chart, labs and discussed the procedure including the risks, benefits and alternatives for the proposed anesthesia with the patient or authorized representative who has indicated his/her understanding and acceptance.   Dental advisory given  Plan Discussed with: CRNA, Anesthesiologist and Surgeon  Anesthesia Plan Comments:         Anesthesia Quick Evaluation

## 2015-03-05 ENCOUNTER — Encounter (HOSPITAL_BASED_OUTPATIENT_CLINIC_OR_DEPARTMENT_OTHER): Payer: Self-pay | Admitting: Orthopedic Surgery

## 2020-05-04 ENCOUNTER — Emergency Department (HOSPITAL_COMMUNITY)
Admission: EM | Admit: 2020-05-04 | Discharge: 2020-05-04 | Disposition: A | Discharge status: Home / Self Care | Payer: Self-pay | Attending: Emergency Medicine | Admitting: Emergency Medicine

## 2020-05-04 ENCOUNTER — Other Ambulatory Visit: Payer: Self-pay

## 2020-05-04 ENCOUNTER — Emergency Department (HOSPITAL_COMMUNITY): Payer: Self-pay

## 2020-05-04 ENCOUNTER — Encounter (HOSPITAL_COMMUNITY): Payer: Self-pay

## 2020-05-04 DIAGNOSIS — M546 Pain in thoracic spine: Secondary | ICD-10-CM | POA: Insufficient documentation

## 2020-05-04 IMAGING — DX DG CHEST 2V
2 series · 2 of 2 positions shown · non-contrast
Comparison: [DATE]

CLINICAL DATA: Back pain, mid back and neck pain for 3 days, LEFT
chest pain the absence of reported injury or trauma.

EXAM:
CHEST - 2 VIEW

[chest pa]
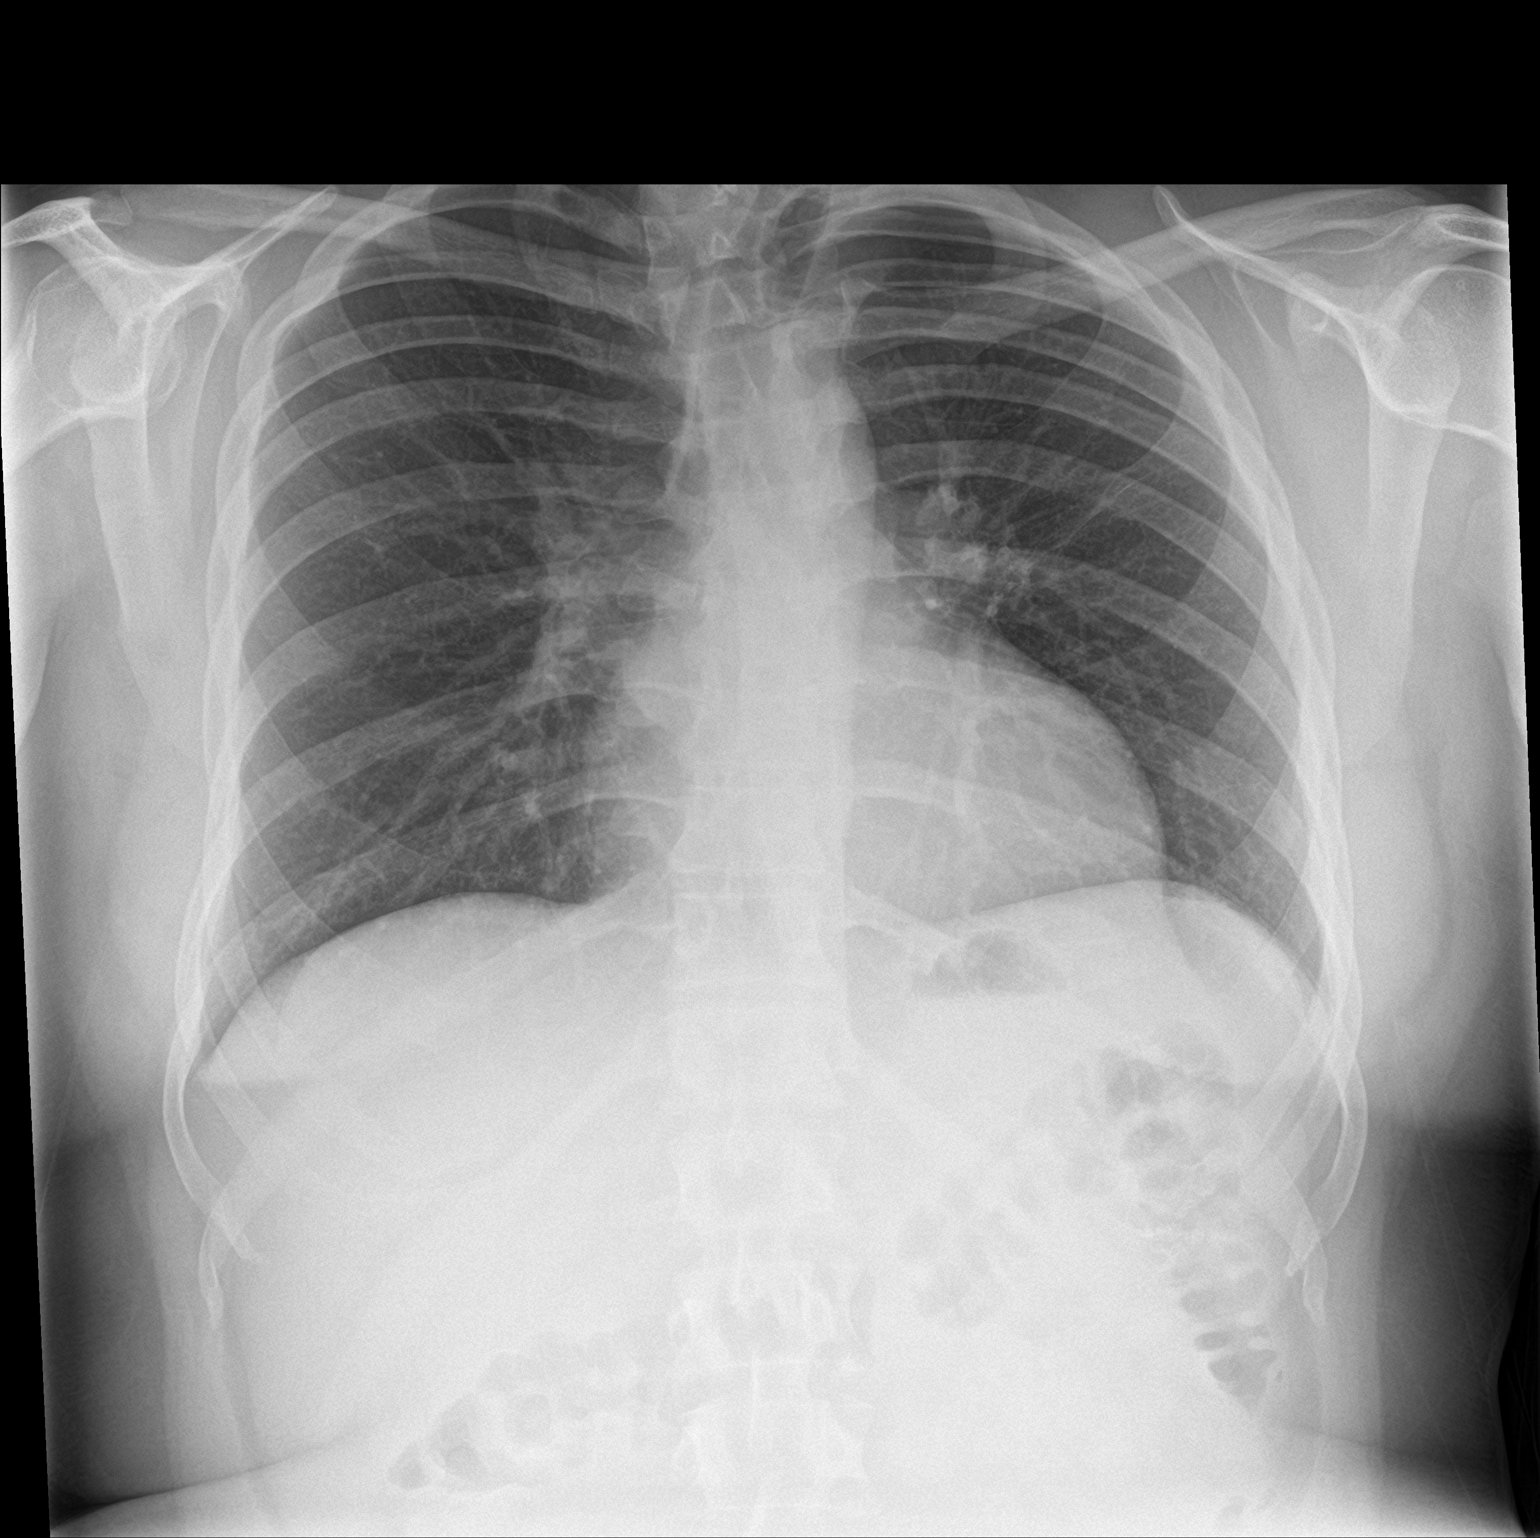

[chest lat]
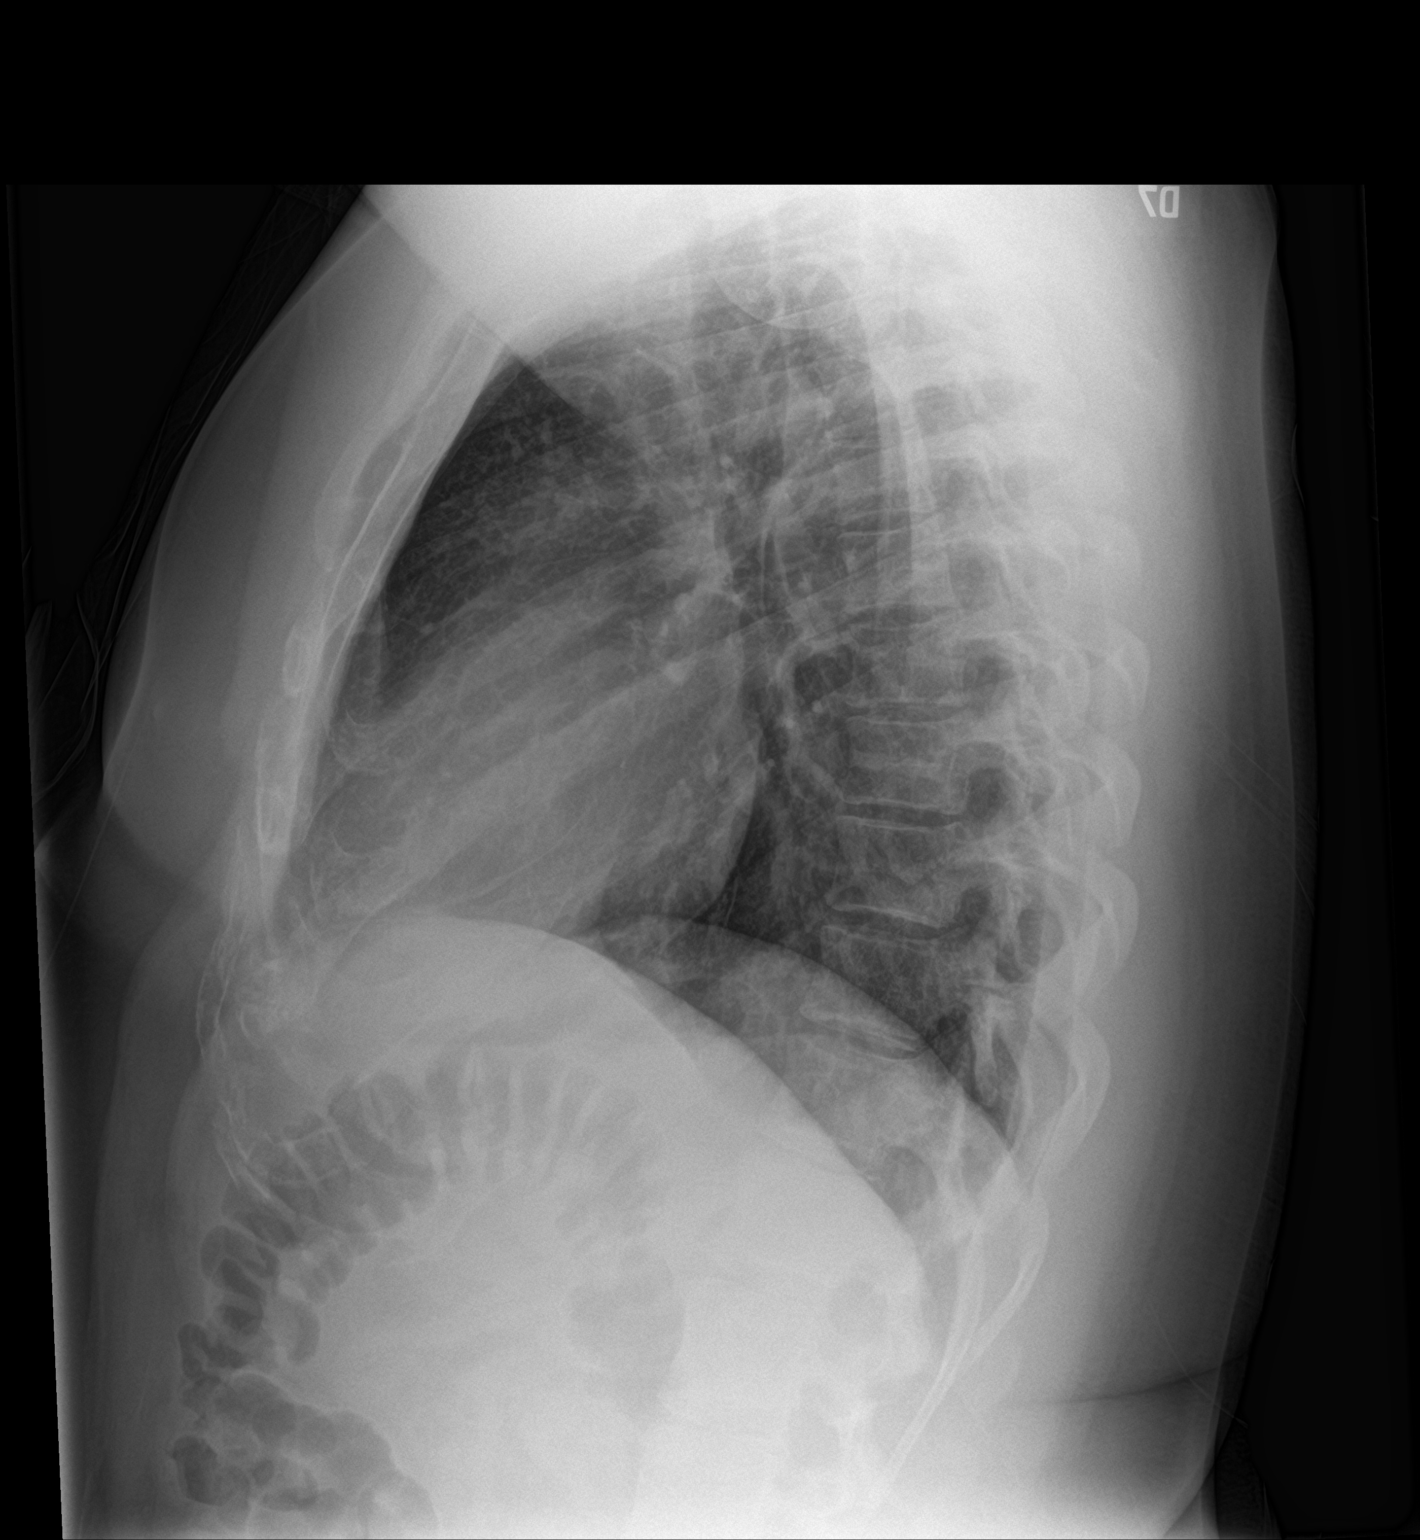

[2 of 2 positions shown; findings below may reference images not displayed]

FINDINGS: Cardiomediastinal contours and hilar structures are normal. Lungs
are clear. No sign of effusion.

On limited assessment no acute skeletal process with marked
dextroconvex curvature of the thoracolumbar junction, worse than on
the study from [14].
IMPRESSION: 1. No acute cardiopulmonary disease.
2. Marked dextroconvex curvature of the thoracolumbar junction,
worse than on the study from [14].

## 2020-05-04 MED ORDER — OXYCODONE-ACETAMINOPHEN 5-325 MG PO TABS
1.0000 | ORAL_TABLET | ORAL | Status: DC | PRN
  Administered 2020-05-04: 1 via ORAL
  Filled 2020-05-04: qty 1

## 2020-05-04 MED ORDER — LIDOCAINE 5 % EX PTCH: 1.0000 | MEDICATED_PATCH | CUTANEOUS | 0 refills | Status: AC

## 2020-05-04 MED ORDER — METHOCARBAMOL 500 MG PO TABS: 500.0000 mg | ORAL_TABLET | Freq: Three times a day (TID) | ORAL | 0 refills | Status: AC

## 2020-05-04 MED ORDER — NAPROXEN 375 MG PO TABS: 375.0000 mg | ORAL_TABLET | Freq: Two times a day (BID) | ORAL | 0 refills | Status: AC

## 2020-05-04 NOTE — ED Triage Notes (Signed)
Pt reports posterior neck pain radiating to his mid to lower back area x3 days. Pt denies any hx of the same or any injury to the area.

## 2020-05-04 NOTE — Discharge Instructions (Signed)
At this time there does not appear to be the presence of an emergent medical condition, however there is always the potential for conditions to change. Please read and follow the below instructions.  Please return to the Emergency Department immediately for any new or worsening symptoms. Please be sure to follow up with your Primary Care Provider within one week regarding your visit today; please call their office to schedule an appointment even if you are feeling better for a follow-up visit.  You may call [facility] to establish a primary care provider. You may use the muscle relaxer Robaxin as prescribed to help with your symptoms.  Do not drive or operate heavy machinery while taking Robaxin as it will make you drowsy.  Do not drink alcohol or take other sedating medications while taking Robaxin as this will worsen side effects. You have been prescribed an NSAID-containing medication called Naproxen today.  Do not take the medications including ibuprofen, Aleve, Advil or other NSAID-containing medications while taking Naproxen.  Please be sure to drink enough water. You may use the Lidoderm patch as prescribed to help with your symptoms.  Lidoderm may be expensive so you may speak with your pharmacist about finding over-the-counter medications that work similarly such as Salon patches. You may call the orthopedic specialist Dr. August Saucer on your discharge paperwork for follow-up visit regarding your back pain.  Get help right away if: You develop new bowel or bladder control problems. You have unusual weakness or numbness in your arms or legs. You develop nausea or vomiting. You develop abdominal pain. You feel faint. You have chest pain or trouble breathing You have any new/concerning or worsening of symptoms  Please read the additional information packets attached to your discharge summary.  Do not take your medicine if  develop an itchy rash, swelling in your  mouth or lips, or difficulty breathing; call 911 and seek immediate emergency medical attention if this occurs.  You may review your lab tests and imaging results in their entirety on your MyChart account.  Please discuss all results of fully with your primary care provider and other specialist at your follow-up visit.  Note: Portions of this text may have been transcribed using voice recognition software. Every effort was made to ensure accuracy; however, inadvertent computerized transcription errors may still be present.

## 2020-05-04 NOTE — ED Provider Notes (Addendum)
[facility] EMERGENCY DEPARTMENT Provider Note   CSN: [id number] Arrival date & time: 05/04/20  9622     History Chief Complaint  Patient presents with  . Back Pain  . Neck Pain    Roger Jackson is a 24 y.o. male reports as history of scoliosis otherwise healthy no daily medication use.  Patient reports that he woke up 3 days ago and noticed pain to his mid upper back he describes a diffuse aching sensation constant nonradiating worsened with palpation and movement, no alleviating factors he has not attempted anything for his symptoms.  Denies similar in the past.  He denies any known injury to the area.  He reports history of scoliosis but this is not bothered him in the past.  Denies fever/chills, chest pain/shortness of breath, headache, abdominal pain, numbness/weakness, tingling, saddle area paresthesias, bowel/bladder incontinence, urinary retention, IV drug use, history of cancer or any additional concerns.  HPI     Past Medical History:  Diagnosis Date  . Anterior dislocation of right shoulder 03/02/2015  . 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 Dental crown present    upper  . Pollen allergy   . Scoliosis   . Shoulder dislocation 02/2015   right    Patient Active Problem List   Diagnosis Date Noted  . Anterior dislocation of right shoulder 03/02/2015    Past Surgical History:  Procedure Laterality Date  . NO PAST SURGERIES    . SHOULDER ARTHROSCOPY WITH BANKART REPAIR Right 03/02/2015   Procedure: RIGHT SHOULDER ARTHROSCOPY WITH DEBRIDEMENT, BANKART REPAIR;  Surgeon: Teryl Lucy, MD;  Location: [facility];  Service: Orthopedics;  Laterality: Right;       No family history on file.  Social History   Tobacco Use  . Smoking status: Never Smoker  . Smokeless tobacco: Never Used  Substance Use Topics  . Alcohol use: No  . Drug use: No    Home Medications Prior to Admission  medications   Medication Sig Start Date End Date Taking? Authorizing Provider  lidocaine (LIDODERM) 5 % Place 1 patch onto the skin daily. Remove & Discard patch within 12 hours or as directed by MD 05/04/20   Bill Salinas, PA-C  methocarbamol (ROBAXIN) 500 MG tablet Take 1 tablet (500 mg total) by mouth 3 (three) times daily for 7 days. 05/04/20 05/11/20  Harlene Salts A, PA-C  naproxen (NAPROSYN) 375 MG tablet Take 1 tablet (375 mg total) by mouth 2 (two) times daily for 7 days. 05/04/20 05/11/20  Bill Salinas, PA-C    Allergies    Patient has no known allergies.  Review of Systems   Review of Systems  Constitutional: Negative.  Negative for chills and fever.  Respiratory: Negative.  Negative for cough and shortness of breath.   Cardiovascular: Negative.  Negative for chest pain.  Gastrointestinal: Negative.  Negative for abdominal pain, nausea and vomiting.  Genitourinary: Negative.  Negative for dysuria, flank pain, hematuria and testicular pain.  Musculoskeletal: Positive for back pain. Negative for joint swelling.  Neurological: Negative.  Negative for weakness, numbness and headaches.       Denies saddle area paresthesias. Denies bowel/bladder incontinence. Denies urinary retention.    Physical Exam Updated Vital Signs BP (!) 142/96 (BP Location: Right Arm)   Pulse 64   Temp 98.3 F (36.8 C) (Oral)   Resp 16   SpO2 100%   Physical Exam Constitutional:      General: He  is not in acute distress.    Appearance: Normal appearance. He is well-developed. He is not ill-appearing or diaphoretic.  HENT:     Head: Normocephalic and atraumatic.  Eyes:     General: Vision grossly intact. Gaze aligned appropriately.     Pupils: Pupils are equal, round, and reactive to light.  Neck:     Trachea: Trachea and phonation normal.  Cardiovascular:     Rate and Rhythm: Normal rate and regular rhythm.     Pulses:          Dorsalis pedis pulses are 2+ on the right side and 2+  on the left side.  Pulmonary:     Effort: Pulmonary effort is normal. No respiratory distress.  Abdominal:     General: There is no distension.     Palpations: Abdomen is soft. There is no pulsatile mass.     Tenderness: There is no abdominal tenderness. There is no guarding or rebound.  Musculoskeletal:        General: Normal range of motion.     Cervical back: Normal range of motion.     Comments: Diffuse tenderness to palpation of the thoracic paraspinal muscles without overlying skin change.  No focal midline tenderness step-off crepitus or deformity. No sign of injury to the neck or back.  Feet:     Right foot:     Protective Sensation: 5 sites tested. 5 sites sensed.     Left foot:     Protective Sensation: 5 sites tested. 5 sites sensed.  Skin:    General: Skin is warm and dry.  Neurological:     Mental Status: He is alert.     GCS: GCS eye subscore is 4. GCS verbal subscore is 5. GCS motor subscore is 6.     Comments: Speech is clear and goal oriented, follows commands Major Cranial nerves without deficit, no facial droop Normal strength in upper and lower extremities bilaterally including dorsiflexion and plantar flexion, strong and equal grip strength Sensation normal x 4 extremities Moves extremities without ataxia, coordination intact Normal gait DTR 2+ bilateral patella, no clonus of the feet.  Psychiatric:        Behavior: Behavior normal.    ED Results / Procedures / Treatments   Labs (all labs ordered are listed, but only abnormal results are displayed) Labs Reviewed - No data to display  EKG None  Radiology DG Chest 2 View  Result Date: 05/04/2020 CLINICAL DATA:  Back pain, mid back and neck pain for 3 days, LEFT chest pain the absence of reported injury or trauma. EXAM: CHEST - 2 VIEW COMPARISON:  June 21, 2004 FINDINGS: Cardiomediastinal contours and hilar structures are normal. Lungs are clear. No sign of effusion. On limited assessment no acute  skeletal process with marked dextroconvex curvature of the thoracolumbar junction, worse than on the study from 1996. IMPRESSION: 1. No acute cardiopulmonary disease. 2. 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 Electronically Signed   By: Donzetta Kohut M.D.   On: 05/04/2020 10:12    Procedures Procedures (including critical care time)  Medications Ordered in ED Medications  oxyCODONE-acetaminophen (PERCOCET/ROXICET) 5-325 MG per tablet 1 tablet (1 tablet Oral Given 05/04/20 0951)    ED Course  I have reviewed the triage vital signs and the nursing notes.  Pertinent labs & imaging results that were available during my care of the patient were reviewed by me and considered in my medical decision making (  see chart for details).    MDM Rules/Calculators/A&P                          Additional history obtained from: 1. Nursing notes from this visit. --------------------- DG Chest 2 view:  IMPRESSION:  1. No acute cardiopulmonary disease.  2. 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  - Owen Browe is a 24 y.o. male presenting with bilateral thoracic back pain onset 3 days ago. Patient denies history of trauma, fever, IV drug use, night sweats, weight loss, cancer, saddle anesthesia, urinary rentention, bowel/bladder incontinence. No neurological deficits and normal neuro exam.  He has consistently reproducible tenderness to the paraspinal muscles without evidence of injury.  Chest x-ray was obtained in triage without acute findings, scoliosis noted.  Suspect muscular etiology of patient's symptoms today, will treat with anti-inflammatories and muscle relaxers and encourage PCP/Ortho follow-up.  Patient states understanding of limitations of x-rays today. Abdomen soft/nontender and without pulsatile mass. Patient with equal pedal pulses.  No evidence of trauma, meningitis, spinal  epidural abscess, cauda equina, dissection, ACS, AAA, kidney stone or other emergent pathologies.  No indication for further work-up at this time.   Patient is ambulatory in the emergency department without assistance. RICE protocol and pain medicine indicated and discussed with patient.   Naproxen 500mg  BID prescribed. Patient denies history of CKD or gastric ulcers/bleeding. Robaxin 500mg  3 times daily prescribed. Patient informed to avoid driving or operating heavy machinery while taking muscle relaxer. Lidoderm patches prescribed for comfort.  At this time there does not appear to be any evidence of an acute emergency medical condition and the patient appears stable for discharge with appropriate outpatient follow up. Diagnosis was discussed with patient who verbalizes understanding of care plan and is agreeable to discharge. I have discussed return precautions with patient who verbalizes understanding. Patient encouraged to follow-up with their PCP and ortho. All questions answered.   Note: Portions of this report may have been transcribed using voice recognition software. Every effort was made to ensure accuracy; however, inadvertent computerized transcription errors may still be present. Final Clinical Impression(s) / ED Diagnoses Final diagnoses:  Acute bilateral thoracic back pain    Rx / DC Orders ED Discharge Orders         Ordered    lidocaine (LIDODERM) 5 %  Every 24 hours        05/04/20 1812    naproxen (NAPROSYN) 375 MG tablet  2 times daily        05/04/20 1812    methocarbamol (ROBAXIN) 500 MG tablet  3 times daily        05/04/20 1812           05/06/20 05/04/20 1816    Elizabeth Palau, MD 05/04/20 2257

## 2020-05-09 ENCOUNTER — Ambulatory Visit: Payer: Self-pay

## 2020-05-09 ENCOUNTER — Encounter: Payer: Self-pay | Admitting: Orthopedic Surgery

## 2020-05-09 ENCOUNTER — Ambulatory Visit (INDEPENDENT_AMBULATORY_CARE_PROVIDER_SITE_OTHER): Payer: 59 | Admitting: Orthopedic Surgery

## 2020-05-09 DIAGNOSIS — Z8739 Personal history of other diseases of the musculoskeletal system and connective tissue: Secondary | ICD-10-CM | POA: Diagnosis not present

## 2020-05-09 DIAGNOSIS — M419 Scoliosis, unspecified: Secondary | ICD-10-CM

## 2020-05-09 NOTE — Progress Notes (Signed)
Office Visit Note   Patient: Roger Jackson           Date of Birth: [birth year]           MRN: [id number] Visit Date: 05/09/2020 Requested by: No referring provider defined for this encounter. PCP: System, Provider Not In  Subjective: Chief Complaint  Patient presents with  . back pain with history of scoliosis    HPI: Patient presents with neck and spine pain.  Patient has a history of scoliosis.  Initially had history of treatment by Dr. Clent Ridges no at [facility].  MRI scan in 2014 demonstrated dextroscoliosis centered at the cervicothoracic junction without nerve compression.  He stopped seeing Dr. Clent Ridges no shortly after that for unclear reasons.  Over the past several months he developed bilateral numbness and tingling in the hands and to a lesser degree in his legs.  Denies any weakness or stumbling in the legs.  Currently he is not working.              ROS: All systems reviewed are negative as they relate to the chief complaint within the history of present illness.  Patient denies  fevers or chills.   Assessment & Plan: Visit Diagnoses:  1. History of scoliosis   2. Scoliosis, unspecified scoliosis type, unspecified spinal region     Plan: Impression is cervical spine and upper thoracic spine scoliosis with fairly significant curvature and likely cord involvement possibly.  No myelopathic signs today.  No real weakness.  Negative clonus negative Babinski no saddle paresthesias no lower extremity weakness or upper extremity weakness.  Needs MRI scan of the cervical and thoracic spine to evaluate scoliosis with likely referral to follow.  Follow-Up Instructions: Return for after MRI.   Orders:  Orders Placed This Encounter  Procedures  . XR SCOLIOSIS EVAL COMPLETE SPINE 2 OR 3 VIEWS  . MR Thoracic Spine w/o contrast  . MR Cervical Spine w/o contrast   No orders of the defined types were placed in this encounter.     Procedures: No procedures performed   Clinical Data: No  additional findings.  Objective: Vital Signs: There were no vitals taken for this visit.  Physical Exam:   Constitutional: Patient appears well-developed HEENT:  Head: Normocephalic Eyes:EOM are normal Neck: Normal range of motion Cardiovascular: Normal rate Pulmonary/chest: Effort normal Neurologic: Patient is alert Skin: Skin is warm Psychiatric: Patient has normal mood and affect    Ortho Exam: Ortho exam demonstrates pretty normal gait alignment.  Negative clonus negative Babinski bilaterally.  Palpable pedal pulses.  Reflexes 1+ out of 4 bilateral patella and Achilles as well as biceps and triceps.  5 out of 5 ankle dorsiflexion plantarflexion quad hamstring strength with no definite paresthesias L1 S1 bilaterally.  Bilateral upper extremities demonstrate no muscle atrophy.  5 out of 5 grip EPL FPL interosseous are flexion extension bicep triceps deltoid strength with palpable radial pulses.  Cervical spine range of motion pretty reasonable with flexion extension as well as with rotation.  No scapular dyskinesia with forward flexion.  No paresthesias C5-T1.  Specialty Comments:  No specialty comments available.  Imaging: XR SCOLIOSIS EVAL COMPLETE SPINE 2 OR 3 VIEWS  Result Date: 05/09/2020 Scoliosis series demonstrates significant dextroscoliosis at the cervical thoracic junction.  No acute fracture.  Lumbar region intact.    PMFS History: Patient Active Problem List   Diagnosis Date Noted  . Anterior dislocation of right shoulder 03/02/2015   Past Medical History:  Diagnosis Date  .  Anterior dislocation of right shoulder 03/02/2015  . 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 Dental crown present    upper  . Pollen allergy   . Scoliosis   . Shoulder dislocation 02/2015   right    No family history on file.  Past Surgical History:  Procedure Laterality Date  . NO PAST SURGERIES    . SHOULDER ARTHROSCOPY WITH BANKART REPAIR Right  03/02/2015   Procedure: RIGHT SHOULDER ARTHROSCOPY WITH DEBRIDEMENT, BANKART REPAIR;  Surgeon: Teryl Lucy, MD;  Location: [facility];  Service: Orthopedics;  Laterality: Right;   Social History   Occupational History  . Not on file  Tobacco Use  . Smoking status: Never Smoker  . Smokeless tobacco: Never Used  Substance and Sexual Activity  . Alcohol use: No  . Drug use: No  . Sexual activity: Not on file

## 2020-06-02 ENCOUNTER — Ambulatory Visit
Admission: RE | Admit: 2020-06-02 | Discharge: 2020-06-02 | Disposition: A | Discharge status: Home / Self Care | Payer: 59 | Source: Ambulatory Visit | Attending: Orthopedic Surgery | Admitting: Orthopedic Surgery

## 2020-06-02 ENCOUNTER — Other Ambulatory Visit: Payer: Self-pay

## 2020-06-02 DIAGNOSIS — M419 Scoliosis, unspecified: Secondary | ICD-10-CM

## 2020-06-02 IMAGING — MR MR THORACIC SPINE W/O CM
4 of 5 series · 21 of 48 positions shown · non-contrast
Comparison: Plain films [DATE]

CLINICAL DATA: Scoliosis.

EXAM:
MRI CERVICAL AND THORACIC SPINE WITHOUT CONTRAST
TECHNIQUE: Multiplanar and multiecho pulse sequences of the cervical spine, to
include the craniocervical junction and cervicothoracic junction,
and the thoracic spine, were obtained without intravenous contrast.

[Series 3: STIR · sagittal · 3.0mm · 1.48mm/px · 3 of 22 slices shown]
[im 4/22]
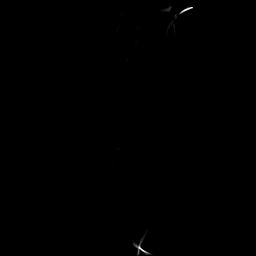
[im 13/22]
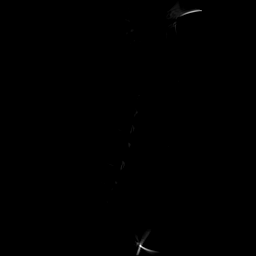
[im 19/22]
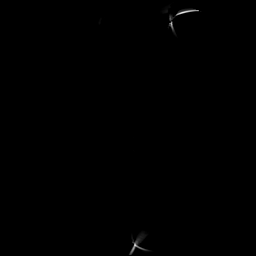

[Series 4: T2 post-contrast · sagittal · 3.0mm · 0.74mm/px · 7 of 22 slices shown]
[im 1/22]
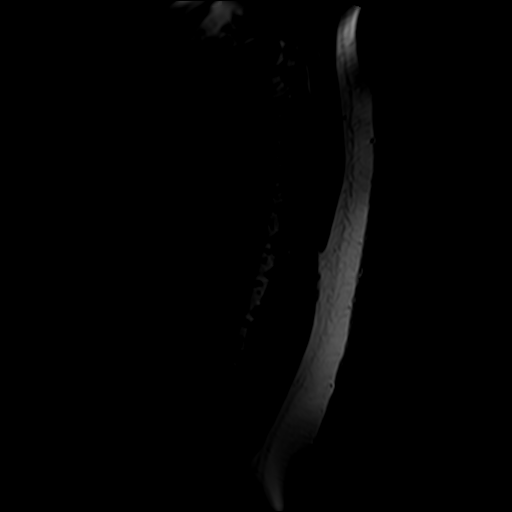
[im 4/22]
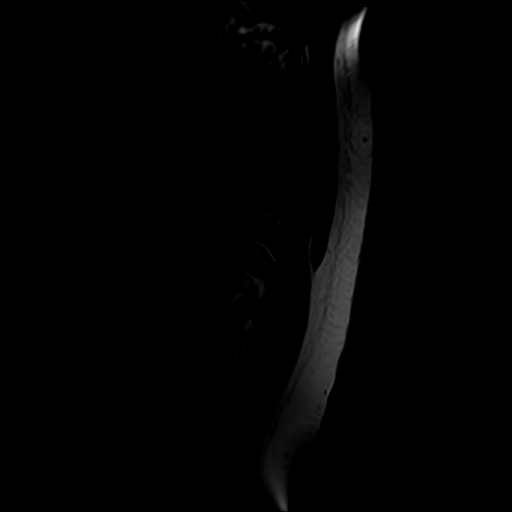
[im 8/22]
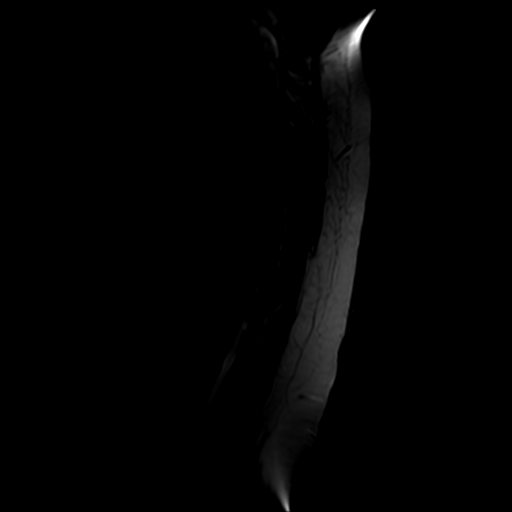
[im 11/22]
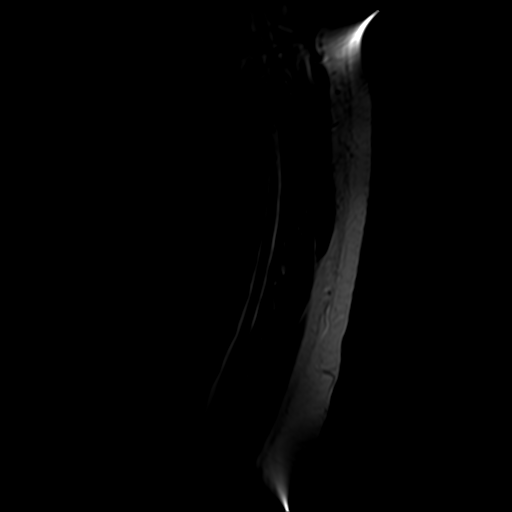
[im 15/22]
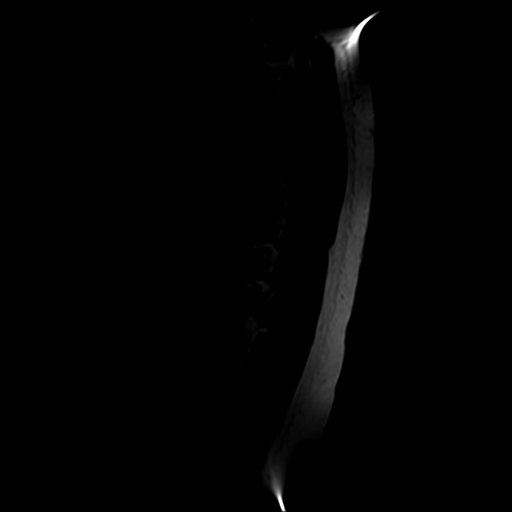
[im 18/22]
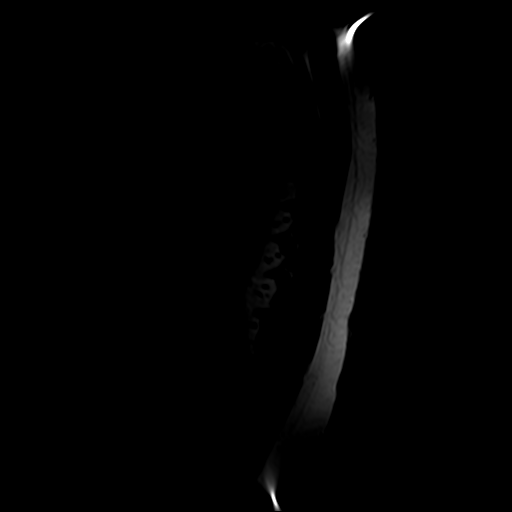
[im 22/22]
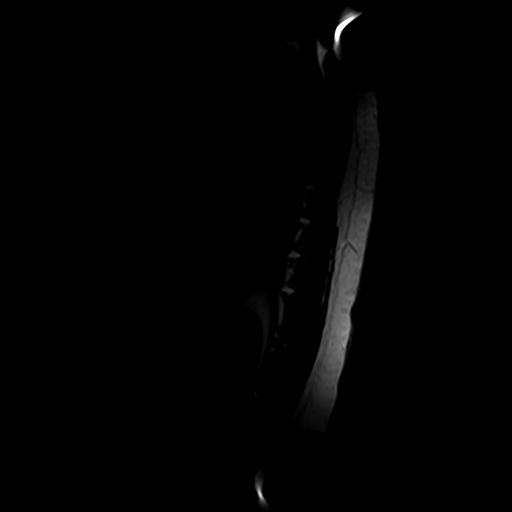

[Series 5: T1 · sagittal · 3.0mm · 0.74mm/px · 3 of 22 slices shown]
[im 4/22]
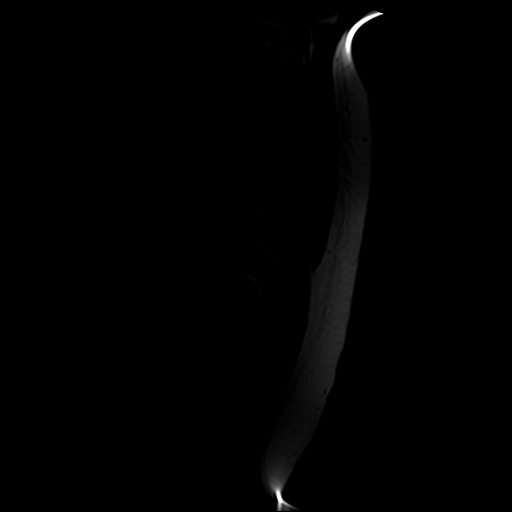
[im 11/22]
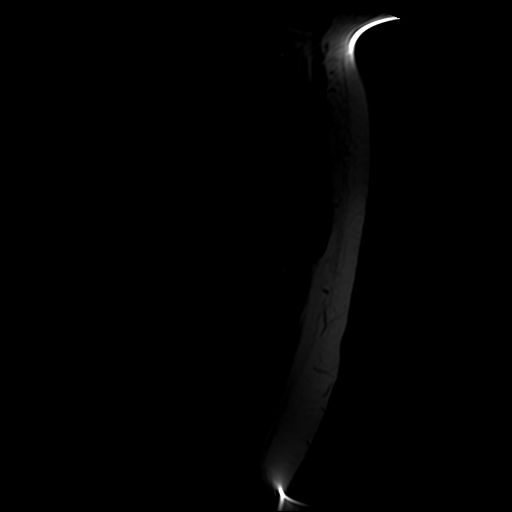
[im 18/22]
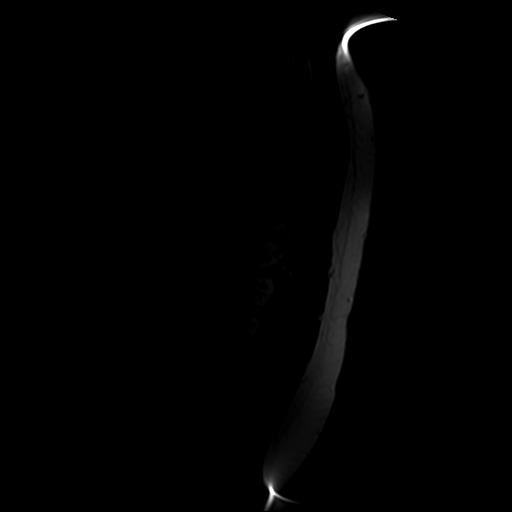

[Series 6: T2 · axial · 4.0mm · 0.39mm/px · z∈[-330,-100]mm · 8 of 39 slices shown]
[im 1/39]
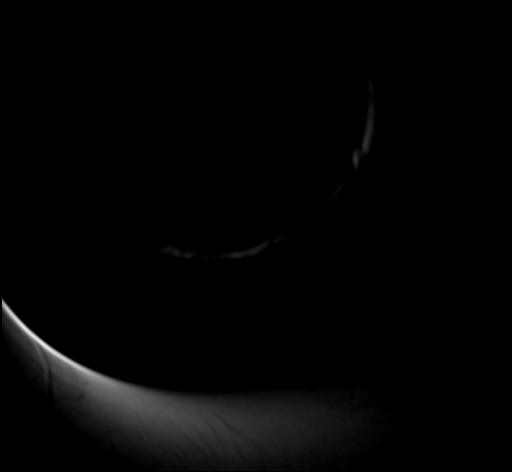
[im 7/39]
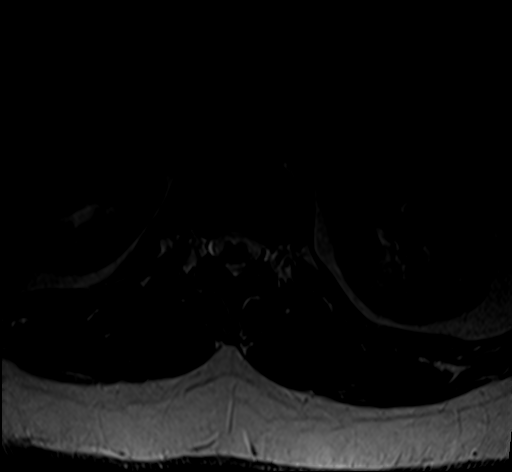
[im 13/39]
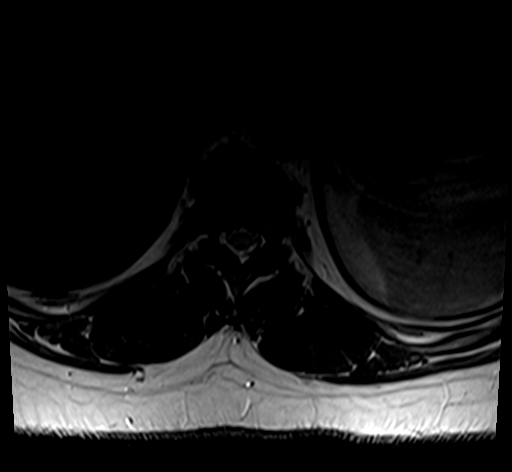
[im 16/39]
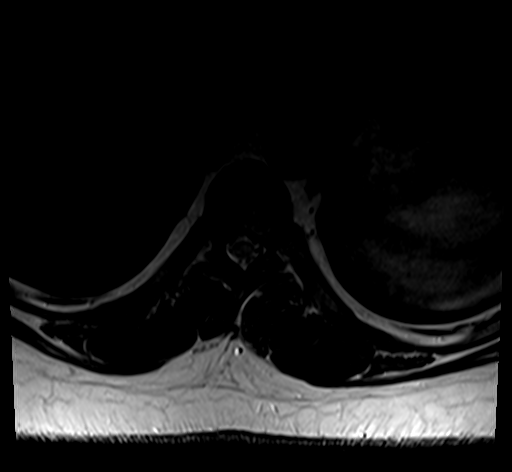
[im 20/39]
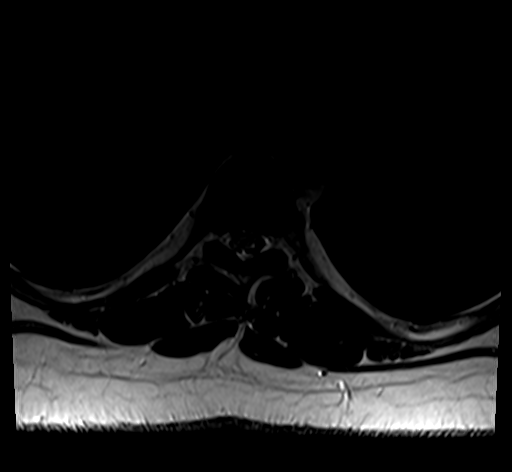
[im 23/39]
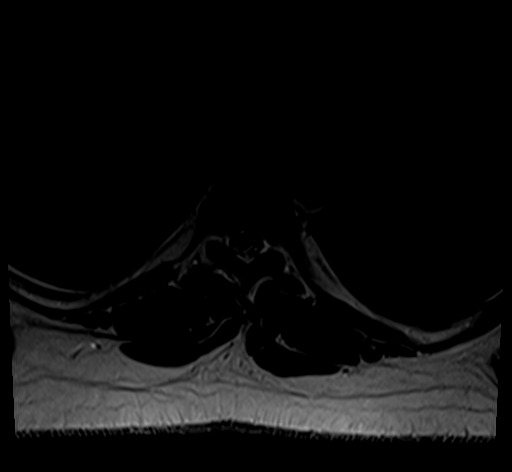
[im 26/39]
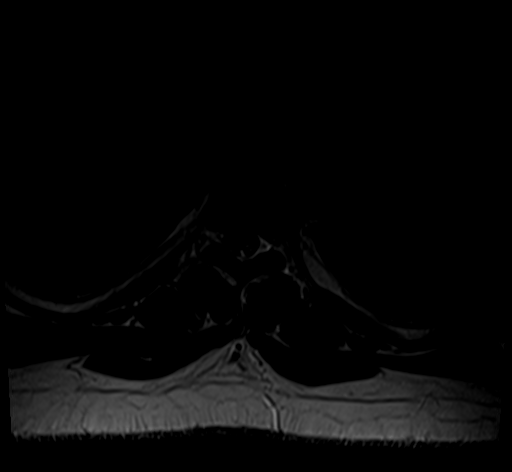
[im 32/39]
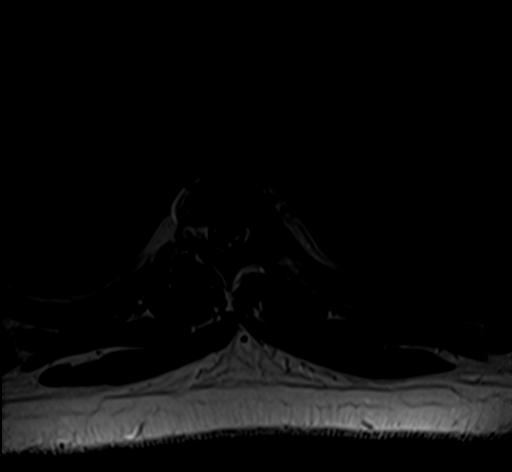

[21 of 48 positions shown; findings below may reference images not displayed]

FINDINGS: MRI CERVICAL SPINE FINDINGS

Alignment: Prominent levoconvex scoliosis of the cervical spine
related to the presence of a free C5 hemivertebra on the left.

Vertebrae: No fracture, evidence of discitis, or aggressive bone
lesion. Left L5 hemivertebra. T1 hypointense, T2 hyperintense lesion
at C[DATE] represent an atypical hemangioma.

Cord: Normal signal and morphology.

Posterior Fossa, vertebral arteries, paraspinal tissues: Negative.

Disc levels:

No significant disc protrusion or spinal canal stenosis at any
level.

At C3-4, uncovertebral degenerative changes resulting in mild right
neural foraminal narrowing.

At C6-7: Facet degenerative changes on the right side resulting in
mild right neural foraminal narrowing.

No significant neural foraminal narrowing the other cervical levels.

MRI THORACIC SPINE FINDINGS

Alignment: Dextroconvex scoliosis of the upper cervical spine
related to the presence of a non segmented right sided T3
hemivertebra fused to T2.

Vertebrae: Right-sided non segmented T3 hemivertebra. No fracture,
evidence of discitis or bone lesion.

Cord:  Normal signal and morphology.

Paraspinal and other soft tissues: Negative.

Disc levels:

Tiny posterior disc protrusions at T1-2, T3-4 and T9-10.

No significant spinal canal or neural foraminal stenosis at any
level.
IMPRESSION: 1. S-shaped scoliosis of the cervical and thoracic spine related to
the presence of a free C5 hemivertebra on the left and non segmented
right T3 hemivertebra fused to T2.
2. No significant spinal canal or neural foraminal stenosis at any
level.

## 2020-06-02 IMAGING — MR MR CERVICAL SPINE W/O CM
4 of 5 series · 28 of 48 positions shown · non-contrast
Comparison: Plain films [DATE]

CLINICAL DATA: Scoliosis.

EXAM:
MRI CERVICAL AND THORACIC SPINE WITHOUT CONTRAST
TECHNIQUE: Multiplanar and multiecho pulse sequences of the cervical spine, to
include the craniocervical junction and cervicothoracic junction,
and the thoracic spine, were obtained without intravenous contrast.

[Series 2: T2 · sagittal · 3.0mm · 0.82mm/px · 6 of 15 slices shown (1 of 2)]
[im 1/15]
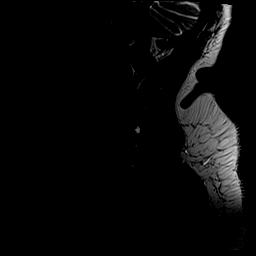
[im 3/15]
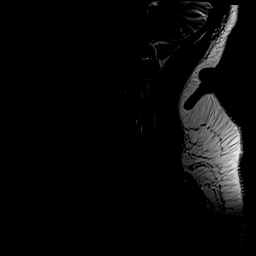
[im 6/15]
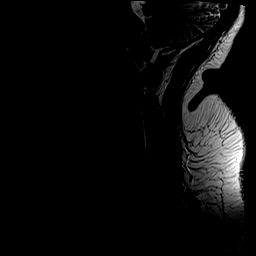
[im 9/15]
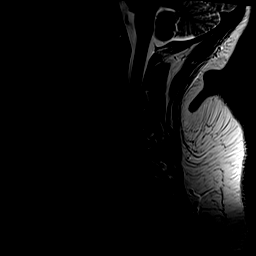
[im 12/15]
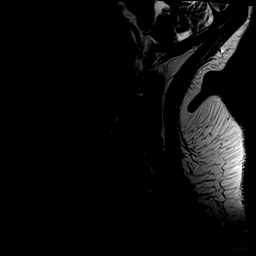
[im 15/15]
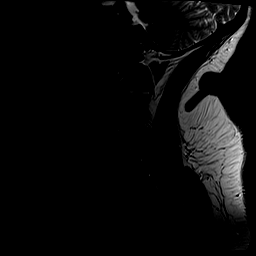

[Series 3: T1 · sagittal · 3.0mm · 0.41mm/px · 7 of 15 slices shown]
[im 1/15]
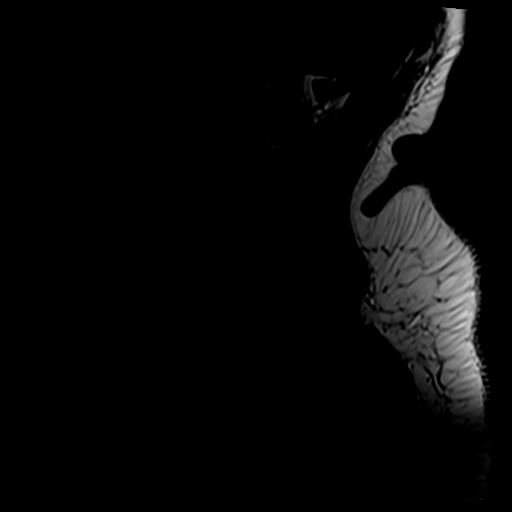
[im 3/15]
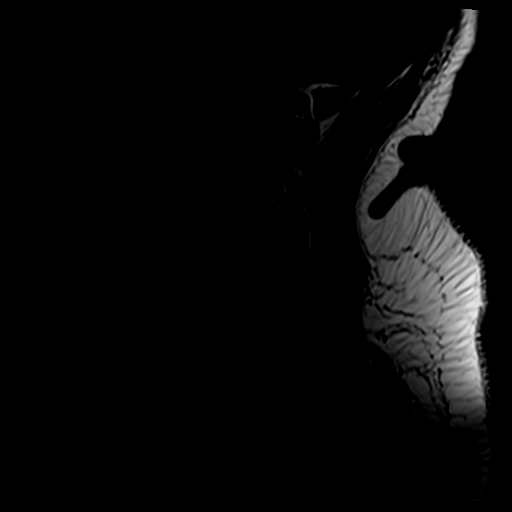
[im 5/15]
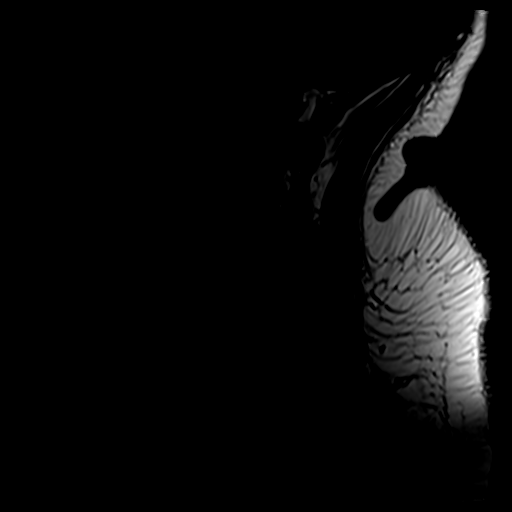
[im 8/15]
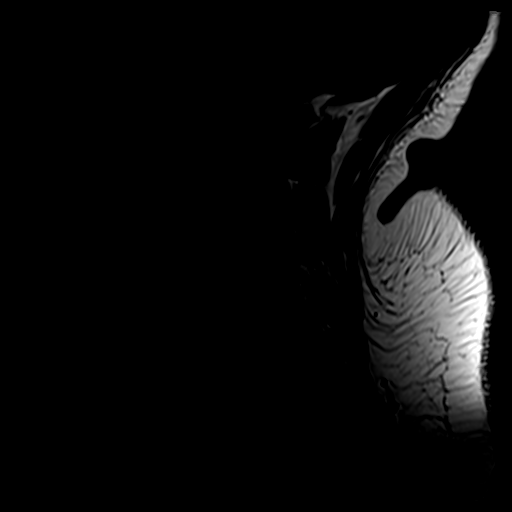
[im 10/15]
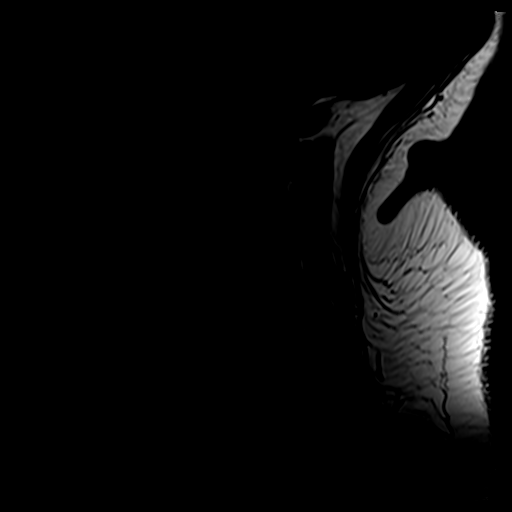
[im 12/15]
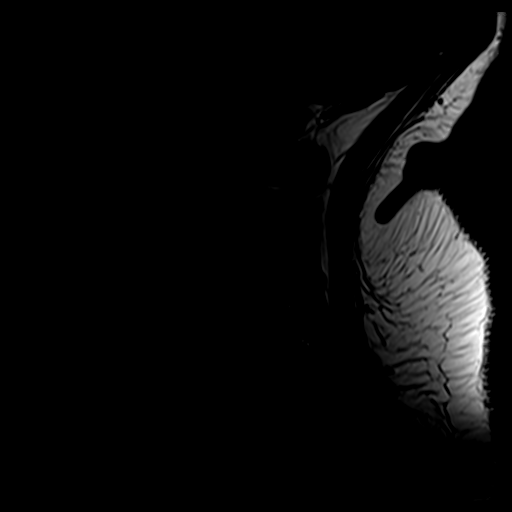
[im 15/15]
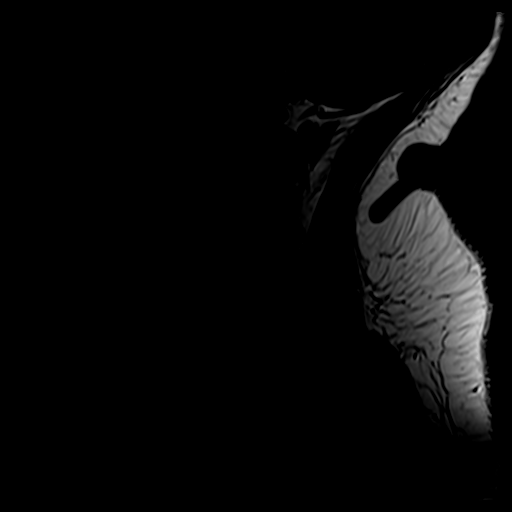

[Series 4: tir sag · sagittal · 3.0mm · 0.41mm/px · 7 of 15 slices shown]
[im 1/15]
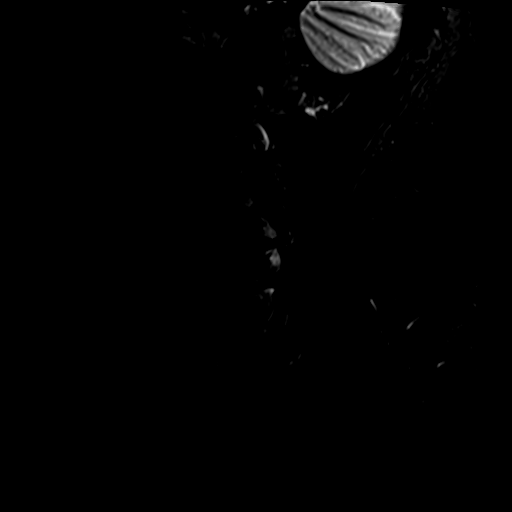
[im 3/15]
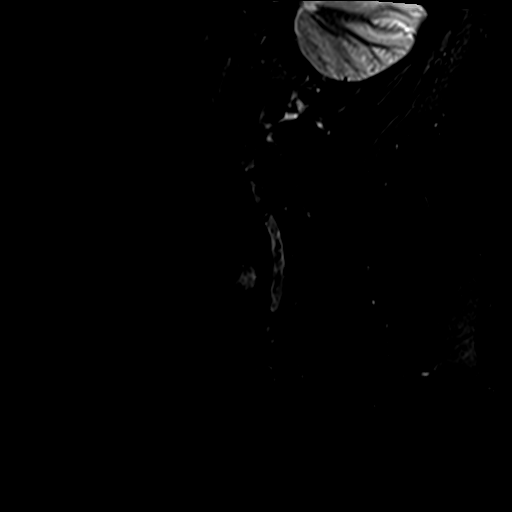
[im 5/15]
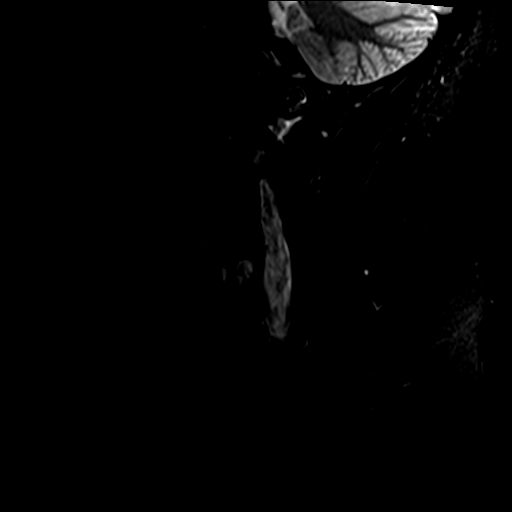
[im 8/15]
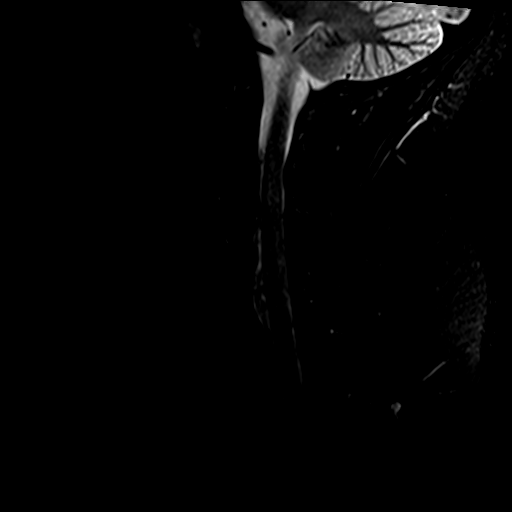
[im 10/15]
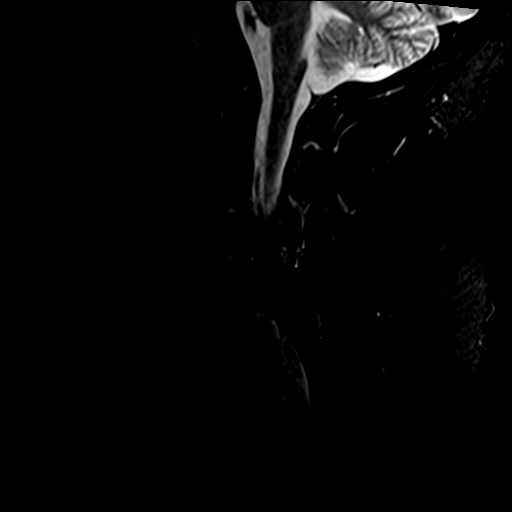
[im 12/15]
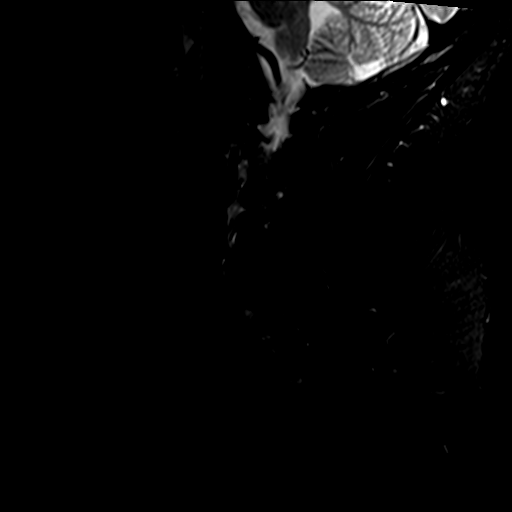
[im 15/15]
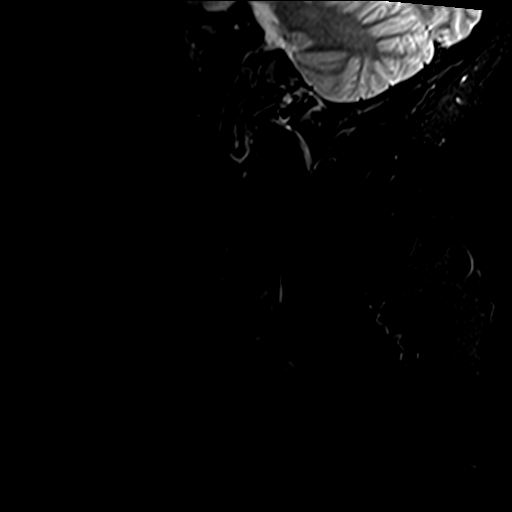

[Series 6: T2 · axial · 3.0mm · 0.78mm/px · z∈[-69,+48]mm · 8 of 32 slices shown (2 of 2)]
[im 1/32]
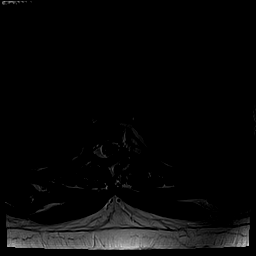
[im 5/32]
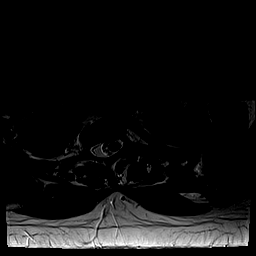
[im 10/32]
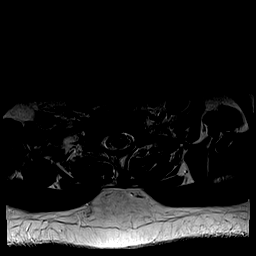
[im 15/32]
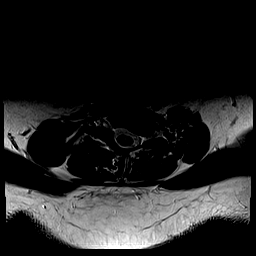
[im 17/32]
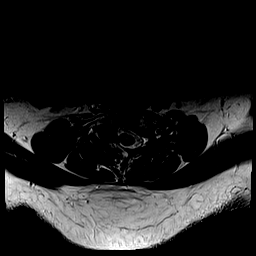
[im 22/32]
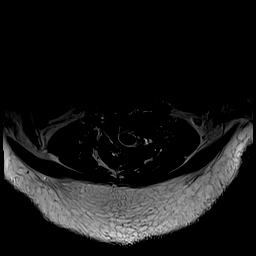
[im 27/32]
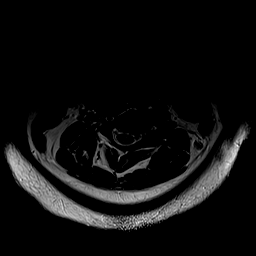
[im 32/32]
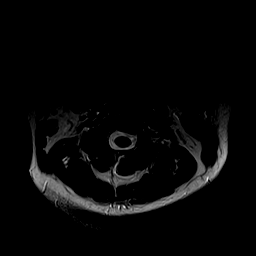

[28 of 48 positions shown; findings below may reference images not displayed]

FINDINGS: MRI CERVICAL SPINE FINDINGS

Alignment: Prominent levoconvex scoliosis of the cervical spine
related to the presence of a free C5 hemivertebra on the left.

Vertebrae: No fracture, evidence of discitis, or aggressive bone
lesion. Left L5 hemivertebra. T1 hypointense, T2 hyperintense lesion
at C[DATE] represent an atypical hemangioma.

Cord: Normal signal and morphology.

Posterior Fossa, vertebral arteries, paraspinal tissues: Negative.

Disc levels:

No significant disc protrusion or spinal canal stenosis at any
level.

At C3-4, uncovertebral degenerative changes resulting in mild right
neural foraminal narrowing.

At C6-7: Facet degenerative changes on the right side resulting in
mild right neural foraminal narrowing.

No significant neural foraminal narrowing the other cervical levels.

MRI THORACIC SPINE FINDINGS

Alignment: Dextroconvex scoliosis of the upper cervical spine
related to the presence of a non segmented right sided T3
hemivertebra fused to T2.

Vertebrae: Right-sided non segmented T3 hemivertebra. No fracture,
evidence of discitis or bone lesion.

Cord:  Normal signal and morphology.

Paraspinal and other soft tissues: Negative.

Disc levels:

Tiny posterior disc protrusions at T1-2, T3-4 and T9-10.

No significant spinal canal or neural foraminal stenosis at any
level.
IMPRESSION: 1. S-shaped scoliosis of the cervical and thoracic spine related to
the presence of a free C5 hemivertebra on the left and non segmented
right T3 hemivertebra fused to T2.
2. No significant spinal canal or neural foraminal stenosis at any
level.

## 2020-06-18 ENCOUNTER — Ambulatory Visit: Payer: 59 | Admitting: Orthopedic Surgery

## 2020-06-28 ENCOUNTER — Ambulatory Visit (INDEPENDENT_AMBULATORY_CARE_PROVIDER_SITE_OTHER): Payer: 59 | Admitting: Orthopedic Surgery

## 2020-06-28 DIAGNOSIS — M542 Cervicalgia: Secondary | ICD-10-CM | POA: Diagnosis not present

## 2020-06-30 ENCOUNTER — Encounter: Payer: Self-pay | Admitting: Orthopedic Surgery

## 2020-06-30 NOTE — Progress Notes (Signed)
Office Visit Note   Patient: Roger Jackson           Date of Birth: [birth year]           MRN: [id number] Visit Date: 06/28/2020 Requested by: No referring provider defined for this encounter. PCP: System, Provider Not In  Subjective: Chief Complaint  Patient presents with  . scan review    HPI: Patient presents for follow-up of MRI cervical and thoracic spine.  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  Continues to have radicular symptoms in both arms.  No definite nerve compression of the spinal cord but it looks like there is some foraminal compression.              ROS: All systems reviewed are negative as they relate to the chief complaint within the history of present illness.  Patient denies  fevers or chills.   Assessment & Plan: Visit Diagnoses:  1. 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  This is something that needs neurosurgical referral.  No weakness on exam today but he is having radicular symptoms in both arms.  Follow-up as needed  Follow-Up Instructions: Return if symptoms worsen or fail to improve.   Orders:  Orders Placed This Encounter  Procedures  . Ambulatory referral to Neurosurgery   No orders of the defined types were placed in this encounter.     Procedures: No procedures performed   Clinical Data: No additional findings.  Objective: Vital Signs: There were no vitals taken for this visit.  Physical Exam:   Constitutional: Patient appears well-developed HEENT:  Head: Normocephalic Eyes:EOM are normal Neck: Normal range of motion Cardiovascular: Normal rate Pulmonary/chest: Effort normal Neurologic: Patient is alert Skin: Skin is warm Psychiatric: Patient has normal mood and affect    Ortho Exam: Ortho exam demonstrates fairly reasonable cervical spine range of motion flexion 25 degrees Extension 35  degrees rotation is difficult to the right at about 10 degrees but he can rotate 40 degrees to the left.  5 out of 5 grip EPL FPL interosseous wrist flexion extension bicep triceps and deltoid strength.  Radial pulse intact bilaterally.  No hyperreflexia biceps or triceps.  Negative clonus bilateral lower extremities.  Good ankle dorsiflexion plantarflexion strength bilaterally  Specialty Comments:  No specialty comments available.  Imaging: No results found.   PMFS History: Patient Active Problem List   Diagnosis Date Noted  . Anterior dislocation of right shoulder 03/02/2015   Past Medical History:  Diagnosis Date  . Anterior dislocation of right shoulder 03/02/2015  . 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 Dental crown present    upper  . Pollen allergy   . Scoliosis   . Shoulder dislocation 02/2015   right    History reviewed. No pertinent family history.  Past Surgical History:  Procedure Laterality Date  . NO PAST SURGERIES    . SHOULDER ARTHROSCOPY WITH BANKART REPAIR Right 03/02/2015   Procedure: RIGHT SHOULDER ARTHROSCOPY WITH DEBRIDEMENT, BANKART REPAIR;  Surgeon: Teryl Lucy, MD;  Location: [facility];  Service: Orthopedics;  Laterality: Right;   Social History   Occupational History  . Not on file  Tobacco Use  . Smoking status: Never Smoker  . Smokeless tobacco: Never Used  Substance and Sexual Activity  . Alcohol use: No  . Drug use: No  .  Sexual activity: Not on file

## 2020-07-03 ENCOUNTER — Encounter: Payer: Self-pay | Admitting: *Deleted
